# Patient Record
Sex: Female | Born: 1960 | Race: White | Hispanic: No | Marital: Single | Smoking: Former smoker
Health system: Southern US, Community
[De-identification: ages and names within clinical notes are randomized; demographics above are authoritative.]

## PROBLEM LIST (undated history)

## (undated) DIAGNOSIS — K5909 Other constipation: Secondary | ICD-10-CM

## (undated) DIAGNOSIS — G43909 Migraine, unspecified, not intractable, without status migrainosus: Secondary | ICD-10-CM

## (undated) DIAGNOSIS — Z842 Family history of other diseases of the genitourinary system: Secondary | ICD-10-CM

## (undated) DIAGNOSIS — F419 Anxiety disorder, unspecified: Secondary | ICD-10-CM

## (undated) DIAGNOSIS — T7840XA Allergy, unspecified, initial encounter: Secondary | ICD-10-CM

## (undated) DIAGNOSIS — IMO0002 Reserved for concepts with insufficient information to code with codable children: Secondary | ICD-10-CM

## (undated) HISTORY — DX: Other constipation: K59.09

## (undated) HISTORY — DX: Allergy, unspecified, initial encounter: T78.40XA

## (undated) HISTORY — PX: TOTAL ABDOMINAL HYSTERECTOMY W/ BILATERAL SALPINGOOPHORECTOMY: SHX83

## (undated) HISTORY — PX: WISDOM TOOTH EXTRACTION: SHX21

## (undated) HISTORY — PX: OTHER SURGICAL HISTORY: SHX169

## (undated) HISTORY — DX: Family history of other diseases of the genitourinary system: Z84.2

## (undated) HISTORY — DX: Migraine, unspecified, not intractable, without status migrainosus: G43.909

---

## 2005-10-30 ENCOUNTER — Ambulatory Visit: Payer: Self-pay | Admitting: Family Medicine

## 2005-11-12 ENCOUNTER — Ambulatory Visit: Payer: Self-pay | Admitting: Family Medicine

## 2005-12-16 ENCOUNTER — Ambulatory Visit: Payer: Self-pay | Admitting: Family Medicine

## 2006-01-08 ENCOUNTER — Ambulatory Visit: Payer: Self-pay | Admitting: Family Medicine

## 2006-02-23 ENCOUNTER — Ambulatory Visit: Payer: Self-pay | Admitting: Family Medicine

## 2006-02-26 ENCOUNTER — Ambulatory Visit: Payer: Self-pay | Admitting: Family Medicine

## 2006-03-17 ENCOUNTER — Ambulatory Visit: Payer: Self-pay | Admitting: Family Medicine

## 2006-04-22 ENCOUNTER — Ambulatory Visit: Payer: Self-pay | Admitting: Family Medicine

## 2006-07-14 DIAGNOSIS — G43909 Migraine, unspecified, not intractable, without status migrainosus: Secondary | ICD-10-CM | POA: Insufficient documentation

## 2006-07-14 DIAGNOSIS — F411 Generalized anxiety disorder: Secondary | ICD-10-CM | POA: Insufficient documentation

## 2006-07-14 DIAGNOSIS — J309 Allergic rhinitis, unspecified: Secondary | ICD-10-CM | POA: Insufficient documentation

## 2006-07-14 DIAGNOSIS — G5600 Carpal tunnel syndrome, unspecified upper limb: Secondary | ICD-10-CM

## 2006-07-14 DIAGNOSIS — F339 Major depressive disorder, recurrent, unspecified: Secondary | ICD-10-CM | POA: Insufficient documentation

## 2006-07-16 ENCOUNTER — Ambulatory Visit: Payer: Self-pay | Admitting: Family Medicine

## 2006-09-03 ENCOUNTER — Ambulatory Visit: Payer: Self-pay | Admitting: Family Medicine

## 2006-09-03 ENCOUNTER — Encounter: Payer: Self-pay | Admitting: Family Medicine

## 2006-09-14 ENCOUNTER — Ambulatory Visit: Payer: Self-pay | Admitting: Family Medicine

## 2006-09-14 LAB — CONVERTED CEMR LAB: Inflenza A Ag: NEGATIVE

## 2006-11-26 ENCOUNTER — Ambulatory Visit: Payer: Self-pay | Admitting: Family Medicine

## 2006-12-29 ENCOUNTER — Telehealth: Payer: Self-pay | Admitting: Family Medicine

## 2007-01-06 ENCOUNTER — Encounter: Payer: Self-pay | Admitting: Family Medicine

## 2007-01-07 ENCOUNTER — Encounter: Payer: Self-pay | Admitting: Family Medicine

## 2007-05-31 ENCOUNTER — Ambulatory Visit: Payer: Self-pay | Admitting: Family Medicine

## 2007-05-31 DIAGNOSIS — R002 Palpitations: Secondary | ICD-10-CM | POA: Insufficient documentation

## 2007-05-31 DIAGNOSIS — IMO0001 Reserved for inherently not codable concepts without codable children: Secondary | ICD-10-CM

## 2007-06-01 ENCOUNTER — Encounter: Payer: Self-pay | Admitting: Family Medicine

## 2007-06-01 LAB — CONVERTED CEMR LAB
HCT: 45.4 % (ref 36.0–46.0)
TSH: 1.3 microintl units/mL (ref 0.350–5.50)
WBC: 8.2 10*3/uL (ref 4.0–10.5)

## 2007-06-02 ENCOUNTER — Telehealth (INDEPENDENT_AMBULATORY_CARE_PROVIDER_SITE_OTHER): Payer: Self-pay | Admitting: *Deleted

## 2007-07-08 ENCOUNTER — Ambulatory Visit: Payer: Self-pay | Admitting: Family Medicine

## 2008-02-10 ENCOUNTER — Ambulatory Visit: Payer: Self-pay | Admitting: Family Medicine

## 2008-03-24 ENCOUNTER — Ambulatory Visit: Payer: Self-pay | Admitting: Family Medicine

## 2008-03-31 ENCOUNTER — Telehealth: Payer: Self-pay | Admitting: Family Medicine

## 2008-03-31 ENCOUNTER — Ambulatory Visit: Payer: Self-pay | Admitting: Family Medicine

## 2008-03-31 DIAGNOSIS — B079 Viral wart, unspecified: Secondary | ICD-10-CM | POA: Insufficient documentation

## 2008-04-10 ENCOUNTER — Telehealth: Payer: Self-pay | Admitting: Family Medicine

## 2008-05-22 ENCOUNTER — Ambulatory Visit: Payer: Self-pay | Admitting: Family Medicine

## 2008-05-22 DIAGNOSIS — M549 Dorsalgia, unspecified: Secondary | ICD-10-CM | POA: Insufficient documentation

## 2008-05-29 ENCOUNTER — Ambulatory Visit: Payer: Self-pay | Admitting: Family Medicine

## 2008-05-29 ENCOUNTER — Encounter: Payer: Self-pay | Admitting: Family Medicine

## 2008-05-29 LAB — CONVERTED CEMR LAB
Bilirubin Urine: NEGATIVE
Blood in Urine, dipstick: NEGATIVE
Eosinophils Absolute: 0 10*3/uL (ref 0.0–0.7)
Glucose, Urine, Semiquant: NEGATIVE
HCT: 45.6 % (ref 36.0–46.0)
Hemoglobin: 15.2 g/dL — ABNORMAL HIGH (ref 12.0–15.0)
Ketones, urine, test strip: NEGATIVE
Lymphocytes Relative: 34 % (ref 12–46)
Monocytes Relative: 3 % (ref 3–12)
Neutro Abs: 4.7 10*3/uL (ref 1.7–7.7)
Nitrite: NEGATIVE
Platelets: 339 10*3/uL (ref 150–400)
Protein, U semiquant: NEGATIVE
RBC: 5.23 M/uL — ABNORMAL HIGH (ref 3.87–5.11)
Specific Gravity, Urine: 1.02
Urobilinogen, UA: 0.2
WBC: 7.4 10*3/uL (ref 4.0–10.5)

## 2008-05-30 LAB — CONVERTED CEMR LAB
AST: 18 units/L (ref 0–37)
Albumin: 4.5 g/dL (ref 3.5–5.2)
Alkaline Phosphatase: 79 units/L (ref 39–117)
CO2: 25 meq/L (ref 19–32)
Calcium: 9.6 mg/dL (ref 8.4–10.5)
Potassium: 4.3 meq/L (ref 3.5–5.3)

## 2008-06-01 ENCOUNTER — Telehealth: Payer: Self-pay | Admitting: Family Medicine

## 2008-06-01 ENCOUNTER — Encounter: Admission: RE | Admit: 2008-06-01 | Discharge: 2008-06-01 | Payer: Self-pay | Admitting: Family Medicine

## 2008-06-01 ENCOUNTER — Ambulatory Visit: Payer: Self-pay | Admitting: Family Medicine

## 2008-06-06 ENCOUNTER — Ambulatory Visit (HOSPITAL_COMMUNITY): Payer: Self-pay | Admitting: Psychology

## 2008-06-15 ENCOUNTER — Ambulatory Visit (HOSPITAL_COMMUNITY): Payer: Self-pay | Admitting: Psychology

## 2008-07-31 ENCOUNTER — Other Ambulatory Visit: Admission: RE | Admit: 2008-07-31 | Discharge: 2008-07-31 | Payer: Self-pay | Admitting: Family Medicine

## 2008-07-31 ENCOUNTER — Encounter: Payer: Self-pay | Admitting: Family Medicine

## 2008-07-31 ENCOUNTER — Ambulatory Visit: Payer: Self-pay | Admitting: Family Medicine

## 2008-07-31 LAB — HM PAP SMEAR

## 2008-08-01 ENCOUNTER — Encounter: Payer: Self-pay | Admitting: Family Medicine

## 2008-08-03 LAB — CONVERTED CEMR LAB
Cholesterol: 164 mg/dL (ref 0–200)
HCV Ab: NEGATIVE
HDL: 60 mg/dL (ref >39)
LDL Cholesterol: 84 mg/dL (ref 0–99)
TSH: 1.971 microintl units/mL (ref 0.350–4.50)
Total CHOL/HDL Ratio: 2.7
Triglycerides: 101 mg/dL (ref <150)

## 2008-10-27 ENCOUNTER — Telehealth (INDEPENDENT_AMBULATORY_CARE_PROVIDER_SITE_OTHER): Payer: Self-pay | Admitting: *Deleted

## 2008-10-30 ENCOUNTER — Ambulatory Visit: Payer: Self-pay | Admitting: Family Medicine

## 2008-11-23 ENCOUNTER — Telehealth: Payer: Self-pay | Admitting: Family Medicine

## 2009-04-18 ENCOUNTER — Telehealth: Payer: Self-pay | Admitting: Family Medicine

## 2009-05-22 ENCOUNTER — Ambulatory Visit: Payer: Self-pay | Admitting: Family Medicine

## 2009-05-24 ENCOUNTER — Ambulatory Visit: Payer: Self-pay | Admitting: Family Medicine

## 2009-05-24 DIAGNOSIS — B429 Sporotrichosis, unspecified: Secondary | ICD-10-CM

## 2009-05-25 ENCOUNTER — Encounter: Payer: Self-pay | Admitting: Family Medicine

## 2009-05-28 ENCOUNTER — Telehealth (INDEPENDENT_AMBULATORY_CARE_PROVIDER_SITE_OTHER): Payer: Self-pay | Admitting: *Deleted

## 2009-06-04 ENCOUNTER — Telehealth: Payer: Self-pay | Admitting: Family Medicine

## 2009-06-14 ENCOUNTER — Encounter: Payer: Self-pay | Admitting: Family Medicine

## 2009-06-15 ENCOUNTER — Encounter: Payer: Self-pay | Admitting: Family Medicine

## 2009-07-02 ENCOUNTER — Encounter: Payer: Self-pay | Admitting: Family Medicine

## 2009-07-06 ENCOUNTER — Telehealth: Payer: Self-pay | Admitting: Family Medicine

## 2009-09-17 ENCOUNTER — Ambulatory Visit: Payer: Self-pay | Admitting: Family Medicine

## 2009-09-17 DIAGNOSIS — K5900 Constipation, unspecified: Secondary | ICD-10-CM | POA: Insufficient documentation

## 2009-09-18 ENCOUNTER — Telehealth: Payer: Self-pay | Admitting: Family Medicine

## 2009-09-19 ENCOUNTER — Encounter (INDEPENDENT_AMBULATORY_CARE_PROVIDER_SITE_OTHER): Payer: Self-pay | Admitting: *Deleted

## 2009-09-25 ENCOUNTER — Telehealth: Payer: Self-pay | Admitting: Family Medicine

## 2009-10-01 ENCOUNTER — Telehealth: Payer: Self-pay | Admitting: Family Medicine

## 2009-11-22 ENCOUNTER — Telehealth: Payer: Self-pay | Admitting: Family Medicine

## 2009-11-23 ENCOUNTER — Telehealth (INDEPENDENT_AMBULATORY_CARE_PROVIDER_SITE_OTHER): Payer: Self-pay | Admitting: *Deleted

## 2009-11-30 ENCOUNTER — Encounter: Payer: Self-pay | Admitting: Family Medicine

## 2010-01-08 ENCOUNTER — Ambulatory Visit: Payer: Self-pay | Admitting: Family Medicine

## 2010-01-08 DIAGNOSIS — M5030 Other cervical disc degeneration, unspecified cervical region: Secondary | ICD-10-CM

## 2010-01-09 ENCOUNTER — Encounter: Payer: Self-pay | Admitting: Family Medicine

## 2010-01-10 LAB — CONVERTED CEMR LAB
ALT: 26 units/L (ref 0–35)
Albumin: 4.4 g/dL (ref 3.5–5.2)
Basophils Absolute: 0 10*3/uL (ref 0.0–0.1)
Basophils Relative: 0 % (ref 0–1)
Calcium: 9 mg/dL (ref 8.4–10.5)
Creatinine, Ser: 0.77 mg/dL (ref 0.40–1.20)
Eosinophils Relative: 2 % (ref 0–5)
FSH: 55.3 milliintl units/mL
Glucose, Bld: 94 mg/dL (ref 70–99)
HCT: 45.3 % (ref 36.0–46.0)
LH: 59 milliintl units/mL
Lymphocytes Relative: 40 % (ref 12–46)
MCHC: 32.7 g/dL (ref 30.0–36.0)
MCV: 88.5 fL (ref 78.0–100.0)
Monocytes Relative: 10 % (ref 3–12)
Neutrophils Relative %: 49 % (ref 43–77)
RDW: 14.1 % (ref 11.5–15.5)
Sodium: 137 meq/L (ref 135–145)
Total CHOL/HDL Ratio: 2.7
Total Protein: 7 g/dL (ref 6.0–8.3)
WBC: 6 10*3/uL (ref 4.0–10.5)

## 2010-01-21 ENCOUNTER — Encounter: Admission: RE | Admit: 2010-01-21 | Discharge: 2010-01-21 | Payer: Self-pay | Admitting: Family Medicine

## 2010-01-21 ENCOUNTER — Ambulatory Visit: Payer: Self-pay | Admitting: Family Medicine

## 2010-01-21 DIAGNOSIS — G43009 Migraine without aura, not intractable, without status migrainosus: Secondary | ICD-10-CM

## 2010-03-05 ENCOUNTER — Ambulatory Visit: Payer: Self-pay | Admitting: Family Medicine

## 2010-03-05 DIAGNOSIS — E669 Obesity, unspecified: Secondary | ICD-10-CM | POA: Insufficient documentation

## 2010-04-05 ENCOUNTER — Ambulatory Visit: Payer: Self-pay | Admitting: Family Medicine

## 2010-04-05 DIAGNOSIS — L299 Pruritus, unspecified: Secondary | ICD-10-CM | POA: Insufficient documentation

## 2010-04-09 ENCOUNTER — Telehealth: Payer: Self-pay | Admitting: Family Medicine

## 2010-04-12 ENCOUNTER — Encounter: Payer: Self-pay | Admitting: Family Medicine

## 2010-05-08 ENCOUNTER — Ambulatory Visit: Payer: Self-pay | Admitting: Family Medicine

## 2010-05-08 DIAGNOSIS — J019 Acute sinusitis, unspecified: Secondary | ICD-10-CM

## 2010-05-17 ENCOUNTER — Telehealth: Payer: Self-pay | Admitting: Family Medicine

## 2010-08-08 ENCOUNTER — Ambulatory Visit (HOSPITAL_COMMUNITY)
Admission: RE | Admit: 2010-08-08 | Discharge: 2010-08-08 | Payer: Self-pay | Source: Home / Self Care | Admitting: Psychiatry

## 2010-08-08 ENCOUNTER — Ambulatory Visit: Payer: Self-pay | Admitting: Family Medicine

## 2010-08-08 DIAGNOSIS — J029 Acute pharyngitis, unspecified: Secondary | ICD-10-CM | POA: Insufficient documentation

## 2010-08-12 ENCOUNTER — Telehealth: Payer: Self-pay | Admitting: Family Medicine

## 2010-08-13 ENCOUNTER — Ambulatory Visit: Payer: Self-pay | Admitting: Family Medicine

## 2010-08-13 ENCOUNTER — Telehealth: Payer: Self-pay | Admitting: Family Medicine

## 2010-08-13 DIAGNOSIS — F329 Major depressive disorder, single episode, unspecified: Secondary | ICD-10-CM | POA: Insufficient documentation

## 2010-08-19 ENCOUNTER — Encounter: Payer: Self-pay | Admitting: Family Medicine

## 2010-08-19 ENCOUNTER — Telehealth: Payer: Self-pay | Admitting: Family Medicine

## 2010-08-20 ENCOUNTER — Ambulatory Visit: Payer: Self-pay | Admitting: Family Medicine

## 2010-08-22 ENCOUNTER — Telehealth (INDEPENDENT_AMBULATORY_CARE_PROVIDER_SITE_OTHER): Payer: Self-pay | Admitting: *Deleted

## 2010-09-11 ENCOUNTER — Ambulatory Visit: Payer: Self-pay | Admitting: Family Medicine

## 2010-09-11 DIAGNOSIS — M25569 Pain in unspecified knee: Secondary | ICD-10-CM

## 2010-09-13 ENCOUNTER — Telehealth (INDEPENDENT_AMBULATORY_CARE_PROVIDER_SITE_OTHER): Payer: Self-pay | Admitting: *Deleted

## 2010-10-08 ENCOUNTER — Telehealth: Payer: Self-pay | Admitting: Family Medicine

## 2010-10-16 ENCOUNTER — Telehealth: Payer: Self-pay | Admitting: Family Medicine

## 2010-11-05 ENCOUNTER — Encounter: Payer: Self-pay | Admitting: Family Medicine
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
## 2010-11-05 NOTE — Letter (Signed)
Summary: Generic Letter  [facility]  [address]   [place], [place] [address]   Phone: [phone]  Fax: [phone]    04/05/2010  ERMALEE MEALY [address] [place], [place]  [address]  To Whom It May Concern,  Ms. Cassandra Ford is medically cleared to work 40 hours per week.        Sincerely,    [name] DO
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
## 2010-11-05 NOTE — Progress Notes (Signed)
Summary: Work note  Phone Note Call from Patient   Caller: Patient Summary of Call: Pt still unable to go to work and needs work note ASAP. Please advise. Initial call taken by: [name] CMA,  August 19, 2010 9:09 AM  Follow-up for Phone Call        she really needs to fax [place] FMLA papers. I've already done 2 work notes. Follow-up by: [name] DO,  August 19, 2010 9:31 AM     Appended Document: Work note Pt will have fax them again. She said she really needs a note for today or she will be fired.
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
## 2010-11-05 NOTE — Progress Notes (Signed)
Summary: no better  Phone Note Call from Patient Call back at Home Phone [phone]   Caller: Patient Call For: [name] DO Summary of Call: CHestcold wprse- been using Zyrtec and is not working, still coughing, chest congestion, bad headache, blood tinged mucous from nose, head congestion. Doesn't feel no better- [facility] [address] [facility] [place]. Told to call back if no better- ? needs antibiotic and cough med Initial call taken by: Kathlene November LPN,  August 12, 2010 10:31 AM  Follow-up for Phone Call        find out first if she was seen at behavioral health, then I will send in her prescriptions. Follow-up by: [name] DO,  August 12, 2010 10:32 AM  Additional Follow-up for Phone Call Additional follow up Details #1::        She says she did call and has appt and was also given a list of counselors to call as well Additional Follow-up by: Kathlene November LPN,  August 12, 2010 10:34 AM    New/Updated Medications: AMOXICILLIN-POT CLAVULANATE 875-125 MG TABS (AMOXICILLIN-POT CLAVULANATE) 1 tab by mouth q 12 hrs x 10 days BENZONATATE 200 MG CAPS (BENZONATATE) 1 capsule by mouth three times a day as needed cough Prescriptions: BENZONATATE 200 MG CAPS (BENZONATATE) 1 capsule by mouth three times a day as needed cough  #30 x 0   Entered and Authorized by:   [name] DO   Signed by:   [name] DO on 08/12/2010   Method used:   Electronically to        [facility] [phone]* (retail)       [address] [place], [place]  [address]       Ph: [phone] or [phone]       Fax: [phone]   RxID:   [id number] AMOXICILLIN-POT CLAVULANATE 875-125 MG TABS (AMOXICILLIN-POT CLAVULANATE) 1 tab by mouth q 12 hrs x 10 days  #20 x 0   Entered and Authorized by:   [name] DO   Signed by:   [name] DO on 08/12/2010   Method used:   Electronically to        [facility] [phone]* (retail)       [address] [place], [place]  [address]       Ph: [phone]  or [phone]       Fax: [phone]   RxID:   [id number]
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
## 2010-11-05 NOTE — Progress Notes (Signed)
Summary: Refill ABX  Phone Note Call from Patient   Caller: Patient Summary of Call: Pt states she completed abx but is not feeling any better. Pt requests refill on abx. Please advise. Initial call taken by: [name] CMA,  May 17, 2010 12:27 PM  Follow-up for Phone Call        Did they help at all?  If so, I will extend her 7 more days.   Follow-up by: [name] DO,  May 17, 2010 12:32 PM  Additional Follow-up for Phone Call Additional follow up Details #1::        Pt said they did help but she is not completely better. Additional Follow-up by: [name] CMA,  May 17, 2010 12:36 PM    Prescriptions: AMOXICILLIN 875 MG TABS (AMOXICILLIN) 1 tab by mouth q 12 hrs x 7 days  #14 x 0   Entered and Authorized by:   [name] DO   Signed by:   [name] DO on 05/17/2010   Method used:   [facility] to        [facility] [phone]* (retail)       [address] [place], [place]  [address]       Ph: [phone] or [phone]       Fax: [phone]   RxID:   [id number]   Appended Document: Refill ABX LMOM informing Pt of the above
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
## 2010-11-05 NOTE — Letter (Signed)
Summary: Out of Work  [facility]  [address]   [place], [place] [address]   Phone: [phone]  Fax: [phone]    May 08, 2010   Employee:  KERRIN MARKMAN    To Whom It May Concern:   For Medical reasons, please excuse the above named employee from work for the following dates:  Start:   Aug 1st, 2nd, 3rd  End:   Aug 4th  If you need additional information, please feel free to contact our office.         Sincerely,    [name] DO
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
## 2010-11-05 NOTE — Progress Notes (Signed)
Summary: Maxalt Rx  Phone Note Call from Patient   Caller: Patient Summary of Call: Pt states Maxalt works well for onset of migraines. Pt needs Rx sent to pharm.  Initial call taken by: [name] CMA,  November 22, 2009 11:57 AM    New/Updated Medications: MAXALT 10 MG TABS (RIZATRIPTAN BENZOATE) 1 tab by mouth x1 as needed migraine  onset/; repeat in 2 hrs if needed Prescriptions: MAXALT 10 MG TABS (RIZATRIPTAN BENZOATE) 1 tab by mouth x1 as needed migraine  onset/; repeat in 2 hrs if needed  #18 x 1   Entered and Authorized by:   [name] DO   Signed by:   [name] DO on 11/22/2009   Method used:   Electronically to        [facility] [phone]* (retail)       [address] [place], [place]  [address]       Ph: [phone] or [phone]       Fax: [phone]   RxID:   [id number]   Appended Document: Maxalt Rx Pt aware

## 2010-11-05 NOTE — Letter (Signed)
Summary: [facility]  [facility]   Imported By: Lanelle Bal 04/23/2010 13:46:08  _____________________________________________________________________  External Attachment:    Type:   Image     Comment:   External Document

## 2010-11-05 NOTE — Progress Notes (Signed)
Summary: Family Medical Leave extension?  Phone Note Call from Patient Call back at Home Phone [phone]   Caller: Patient Call For: [name] DO Summary of Call: Pt calls and wanted to know if her Family Medical leave had been extended for another month. Also wanted xanax filled- states pharmacy never received rx from our office on 9th.  [facility]- gave verbal refill to pharmacist. Please call pt in regards to the family medical leave if been done Initial call taken by: Kathlene November LPN,  September 13, 2010 1:51 PM  Follow-up for Phone Call        Pt aware that Rx has been called in. FMLA papers are being worked on as well Follow-up by: [name] CMA,  September 13, 2010 2:54 PM

## 2010-11-05 NOTE — Progress Notes (Signed)
Summary: work note  Phone Note Call from Patient Call back at [employer] [phone]   Caller: Patient Call For: [name] DO Summary of Call: Pt calls again today and wanting to know if could get extension on work note and how will it take before antibiotic will make her fell better because she feels awful Initial call taken by: Kathlene November LPN,  August 13, 2010 8:58 AM  Follow-up for Phone Call        The work note was written on the premise of her getting psychiatric care which she has not yet done.  It will take her 2-3 days on abx to start feeling better.  I can write her out for her sinusitis today and tomorrow only. Follow-up by: [name] DO,  August 13, 2010 10:05 AM     Appended Document: work note 08/13/2010- Spoke with pt and given MD instructions and she says she made appt with MD today at 11am. Also she says she is very confused and that psych can not see her for months. Instructed her to call Behavioral Health back and go ahead and set up appt with counselor until can be seen by psych.KJ LPN
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
## 2010-11-05 NOTE — Letter (Signed)
Summary: Out of Work  [facility]  [address]   [place], [place] [address]   Phone: [phone]  Fax: [phone]    January 21, 2010   Employee:  YONEKO TALERICO    To Whom It May Concern:   For Medical reasons, please excuse the above named employee from work for the following dates:  Start:   April 18th -19th  End:   April 20th  If you need additional information, please feel free to contact our office.         Sincerely,    [name] DO

## 2010-11-05 NOTE — Progress Notes (Signed)
Summary: RF alpazolam  Phone Note Refill Request Message from:  Fax from Pharmacy on April 09, 2010 2:53 PM  Refills Requested: Medication #1:  ALPRAZOLAM 0.5 MG TABS Take 1 tablet by mouth two times a day   Supply Requested: 1 month   Last Refilled: 03/08/2010   Notes: 1 tab twice per day Initial call taken by: Fabienne Bruns,  April 09, 2010 2:53 PM    Prescriptions: ALPRAZOLAM 0.5 MG TABS (ALPRAZOLAM) Take 1 tablet by mouth two times a day  #60 x 0   Entered and Authorized by:   [name] DO   Signed by:   [name] DO on 04/09/2010   Method used:   Printed then faxed to ...       [facility]  [place] [phone]* (retail[facility]       [address] [place], [place]  [address]       Ph: [phone] or [phone]       Fax: [phone]   RxID:   [id number]

## 2010-11-05 NOTE — Letter (Signed)
Summary: Out of Work  [facility]  [address]   [place], [place] [address]   Phone: [phone]  Fax: [phone]    August 13, 2010   Employee:  TAEJAH OHALLORAN    To Whom It May Concern:   For Medical reasons, please excuse the above named employee from work for the following dates:  Start:   Nov 8th - 11th  End:   Nov 12th  If you need additional information, please feel free to contact our office.         Sincerely,    [name] DO

## 2010-11-05 NOTE — Progress Notes (Signed)
Summary: [facility] CASE [id number] FOR MAXALT   Phone Note Outgoing Call   Call placed by: [facility] Call placed to: [facility] Summary of Call: [facility] CASE ID# [id number] FOR MAXALT 10 MG TABLETS FAXING FORM TO MICHELLE [facility] Initial call taken by: Roselle Locus,  November 23, 2009 11:43 AM  Follow-up for Phone Call        Form placed on Dr. Ovidio Kin desk Follow-up by: [name] CMA,  November 23, 2009 1:14 PM
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
## 2010-11-05 NOTE — Medication Information (Signed)
Summary: Approval for Additional Quantity Maxalt/[facility]  Approval for Additional Quantity Maxalt/[facility]   Imported By: Lanelle Bal 12/06/2009 09:44:29  _____________________________________________________________________  External Attachment:    Type:   Image     Comment:   External Document

## 2010-11-05 NOTE — Letter (Signed)
Summary: Out of Work  [facility]  [address]   [place], [place] [address]   Phone: [phone]  Fax: [phone]    August 08, 2010   Employee:  MIONNA ADVINCULA    To Whom It May Concern:   For Medical reasons, please excuse the above named employee from work for the following dates:  Start:   Nov 3rd - 9th  End:   Nov 10th  If you need additional information, please feel free to contact our office.         Sincerely,    [name] DO

## 2010-11-05 NOTE — Letter (Signed)
Summary: Out of Work  [facility]  [address]   [place], [place] [address]   Phone: [phone]  Fax: [phone]    August 19, 2010   Employee:  Cassandra Ford    To Whom It May Concern:   For Medical reasons, please excuse the above named employee from work for the following dates:  Start:   Nov 14th  End:   Nov 15th  If you need additional information, please feel free to contact our office.         Sincerely,    [name] DO

## 2010-11-05 NOTE — Progress Notes (Signed)
Summary: FMLA  Phone Note Call from Patient Call back at [phone]   Caller: Patient Call For: [name] DO Summary of Call: pt called and states FMLA paperwork should be faxed to Dr. Ovidio Kin attention and it needs to be filled out and faxed and then mail the original form.Pt wants to know if the FMLA will cover tomorrow.Pt would like a call back Initial call taken by: Avon Gully CMA, Duncan Dull),  August 19, 2010 3:41 PM  Follow-up for Phone Call        I can back date it which is fine.  Due to a full schedule, it may take up to a week for me to complete this. Follow-up by: [name] DO,  August 19, 2010 4:13 PM     Appended Document: FMLA Pt aware of the above.

## 2010-11-05 NOTE — Progress Notes (Signed)
Summary: Out of work note questions and FMLA  Phone Note Call from Patient Call back at [employer] [phone]   Caller: Patient Call For: [name] DO Summary of Call: Please call patient there is confusion on work note and FMLA. Pt states you told her that there was a note faxed to her work putting her out until december but I do not see any thing in chart regarding this date. Says judge called and LM on her VM in regards to her job. Initial call taken by: Kathlene November LPN,  August 22, 2010 3:37 PM  Follow-up for Phone Call        [facility] informing Pt that FMLA papers were filled out and faxed to local employer as well as her state HR office. Copy of FMLA not yet in chart bc it has been to scanning.  Follow-up by: [name] CMA,  August 23, 2010 9:35 AM

## 2010-11-05 NOTE — Progress Notes (Signed)
Summary: Med refill  Phone Note Refill Request Message from:  Patient on September 13, 2010 1:30 PM  Refills Requested: Medication #1:  ALPRAZOLAM 0.5 MG TABS Take 1 tablet by mouth two times a day   Dosage confirmed as above?[facility] [address].... Pt would like a phone call when it's ready.  Initial call taken by: Michaelle Copas,  September 13, 2010 1:31 PM    Prescriptions: ALPRAZOLAM 0.5 MG TABS (ALPRAZOLAM) Take 1 tablet by mouth two times a day  #60 x 0   Entered by:   [name] CMA   Authorized by:   [name] DO   Signed by:   [name] CMA on 09/13/2010   Method used:   Printed then faxed to ...       [facility]  [place] [phone][facility] (retail)       [address] [place], [place]  [address]       Ph: [phone] or [phone]       Fax: [phone]   RxID:   [id number]

## 2010-11-07 NOTE — Progress Notes (Signed)
Summary: FMLA papers  Phone Note From Other Clinic   Caller: [facility] Call For: [facility] Summary of Call: Pt told Dr. Suzan Garibaldi office that you had FMLA papers for her here and they want those papers faxed to their office so they can fill them out. Fax to [phone] Initial call taken by: Kathlene November LPN,  October 08, 2010 2:28 PM  Follow-up for Phone Call        I do not have a new set.  We requested the last ones directly from Niyanna's HR rep. Follow-up by: [name] DO,  October 08, 2010 3:58 PM  Additional Follow-up for Phone Call Additional follow up Details #1::        Called [facility] and informed them we did not have any. She will call pt and have her get them from her HR rep. Additional Follow-up by: Kathlene November LPN,  October 08, 2010 4:03 PM

## 2010-11-07 NOTE — Progress Notes (Signed)
Summary: FMLA   Phone Note Call from Patient   Caller: Patient Summary of Call: Pt. called and wants to know if Dr.Taevion Sikora will be filling our FMLA extension to 11/12/10, per Mila Homer.... Pt. states that Dr. Lequita Halt needs to know and patient needs this done TODAY. Please call patient at [phone] Initial call taken by: Michaelle Copas,  October 16, 2010 1:54 PM  Follow-up for Phone Call        per my last note, she was extended till Jan 7th (?)  since she is f/u with psych and the reason she is out is due to psych reasons, they need to fill out an up to date FMLA this time. Follow-up by: [name] DO,  October 16, 2010 1:57 PM

## 2010-11-13 ENCOUNTER — Encounter: Payer: Self-pay | Admitting: Family Medicine

## 2010-11-14 ENCOUNTER — Telehealth (INDEPENDENT_AMBULATORY_CARE_PROVIDER_SITE_OTHER): Payer: Self-pay | Admitting: *Deleted

## 2010-11-20 ENCOUNTER — Encounter: Payer: Self-pay | Admitting: Family Medicine

## 2010-11-20 ENCOUNTER — Ambulatory Visit (INDEPENDENT_AMBULATORY_CARE_PROVIDER_SITE_OTHER): Payer: BC Managed Care – PPO | Admitting: Family Medicine

## 2010-11-20 DIAGNOSIS — G43009 Migraine without aura, not intractable, without status migrainosus: Secondary | ICD-10-CM

## 2010-11-20 DIAGNOSIS — R413 Other amnesia: Secondary | ICD-10-CM | POA: Insufficient documentation

## 2010-11-20 DIAGNOSIS — F329 Major depressive disorder, single episode, unspecified: Secondary | ICD-10-CM

## 2010-11-21 NOTE — Progress Notes (Signed)
Summary: Refill Request  Phone Note Refill Request Call back at Home Phone [phone] [facility]   Message from:  Patient on November 14, 2010 2:24 PM  Refills Requested: Medication #1:  ALPRAZOLAM 0.5 MG TABS Take 1 tablet by mouth two times a day   Dosage confirmed as above?Dosage Confirmed   Brand Name Necessary? No Patient is completey out of med. and would like a phone call back when this has been called in.Jennifer Rich  November 14, 2010 2:24 PM   Initial call taken by: Michaelle Copas,  November 14, 2010 2:24 PM    Prescriptions: ALPRAZOLAM 0.5 MG TABS (ALPRAZOLAM) Take 1 tablet by mouth two times a day  #60 x 0   Entered by:   [name] CMA   Authorized by:   [name] DO   Signed by:   [name] CMA on 11/15/2010   Method used:   Printed then faxed to ...       [facility]  [place] [phone]* (retail)       [address] [place], [place]  [address]       Ph: [phone] or [phone]       Fax: [phone]   RxID:   [id number]
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
## 2010-12-03 NOTE — Letter (Signed)
Summary: [facility]  [facility]   Imported By: Lanelle Bal 11/28/2010 11:18:06  _____________________________________________________________________  External Attachment:    Type:   Image     Comment:   External Document

## 2010-12-05 ENCOUNTER — Encounter: Payer: Self-pay | Admitting: Family Medicine

## 2010-12-24 NOTE — Letter (Signed)
Summary: [facility]  [facility]   Imported By: Maryln Gottron 12/17/2010 08:38:24  _____________________________________________________________________  External Attachment:    Type:   Image     Comment:   External Document

## 2011-01-06 ENCOUNTER — Other Ambulatory Visit: Payer: Self-pay | Admitting: Family Medicine

## 2011-01-07 ENCOUNTER — Encounter: Payer: Self-pay | Admitting: Family Medicine

## 2011-01-08 ENCOUNTER — Ambulatory Visit (INDEPENDENT_AMBULATORY_CARE_PROVIDER_SITE_OTHER): Payer: BC Managed Care – PPO | Admitting: Family Medicine

## 2011-01-08 ENCOUNTER — Encounter: Payer: Self-pay | Admitting: Family Medicine

## 2011-01-08 DIAGNOSIS — L255 Unspecified contact dermatitis due to plants, except food: Secondary | ICD-10-CM

## 2011-01-08 DIAGNOSIS — F329 Major depressive disorder, single episode, unspecified: Secondary | ICD-10-CM

## 2011-01-08 MED ORDER — RIZATRIPTAN BENZOATE 10 MG PO TABS: 10.0000 mg | ORAL_TABLET | ORAL | Status: DC | PRN

## 2011-01-08 MED ORDER — PREDNISONE 20 MG PO TABS: ORAL_TABLET | ORAL | Status: DC

## 2011-01-08 MED ORDER — HYDROXYZINE HCL 25 MG PO TABS: 25.0000 mg | ORAL_TABLET | Freq: Every evening | ORAL | Status: DC | PRN

## 2011-01-08 NOTE — Patient Instructions (Signed)
Start Prednisone 3 tabs once daily x 2 days then                             2 tabs once daily x 2 days then                             1 tab po once daily x 2 days then                             1/2 tab po once daily x 2 days.  This will help the rash and itching. Use topical Caladryl cream and Hydroxyzine at night.  I will talk to your neurologist re: further workup.

## 2011-01-08 NOTE — Progress Notes (Signed)
  Subjective:    Patient ID: Cassandra Ford, female    DOB: April 21, 1961, 50 y.o.   MRN: [id number]  HPI  50 yo WF with hx of depression presents for an itchy rash on her hands and face that started 3 days ago after playing in the garden.  She is taking oral benadryl and using topical hydrocortisone but it's not helping.  Denies fevers or chills or sore throat.  She is has some swelling in her hands from the rash.  She continues to c/o mental slowing, problems getting words out, etc but she says that she has f/u with Dr Lenise Arena (neuro).  BP 136/86  Pulse 97  Temp(Src) 98.3 F (36.8 C) (Oral)  Ht 5' 6.5" (1.689 m)  Wt 213 lb (96.616 kg)  BMI 33.86 kg/m2  SpO2 96%  Review of Systems  Constitutional: Negative for fever, fatigue and unexpected weight change.  HENT: Negative for sore throat.   Respiratory: Positive for cough. Negative for shortness of breath.   Cardiovascular: Negative for chest pain and leg swelling.  Neurological: Positive for headaches.  Psychiatric/Behavioral: Positive for dysphoric mood. The patient is nervous/anxious.        Objective:   Physical Exam  Constitutional: She appears well-developed and well-nourished. No distress.  HENT:  Head: Normocephalic and atraumatic.  Eyes: Conjunctivae are normal.  Neck: Normal range of motion. Neck supple.  Cardiovascular: Normal rate, regular rhythm and normal heart sounds.   Pulmonary/Chest: Effort normal and breath sounds normal. She has no wheezes.  Musculoskeletal: She exhibits no edema.  Lymphadenopathy:    She has no cervical adenopathy.  Skin: Skin is warm. Rash (linear vesicular rash on hands, face) noted.  Psychiatric:       Flat affect, slowed speech          Assessment & Plan:

## 2011-01-08 NOTE — Assessment & Plan Note (Signed)
Hx/ exam consistent with rhus dermatitis.  Will treat with oral prednisone taper, RX Hydroxyzine at night for pruritis and topical caladryl cream.  Avoid scratching.  Call if not resolved in 7-10 days.

## 2011-01-08 NOTE — Assessment & Plan Note (Signed)
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  She has had slurred speech, flat affect and 'mental slowing'  Which has kept her out of work.  I do not have any psych notes and I will talk to Dr Lenise Arena, her neurologist about her cause.  She is interested in persuing a w/u with neuropysch.

## 2011-01-28 ENCOUNTER — Telehealth: Payer: Self-pay | Admitting: Family Medicine

## 2011-01-28 DIAGNOSIS — M5136 Other intervertebral disc degeneration, lumbar region: Secondary | ICD-10-CM

## 2011-01-28 NOTE — Telephone Encounter (Signed)
Patient is requesting a referral re: her lumbar issues with L4 and buldging disk and is a lot of pain. You can advise her by calling her back at [phone]. Thanks, [employer]

## 2011-01-28 NOTE — Telephone Encounter (Signed)
Done

## 2011-02-06 ENCOUNTER — Ambulatory Visit (INDEPENDENT_AMBULATORY_CARE_PROVIDER_SITE_OTHER): Payer: BC Managed Care – PPO | Admitting: Family Medicine

## 2011-02-06 ENCOUNTER — Encounter: Payer: Self-pay | Admitting: Family Medicine

## 2011-02-06 DIAGNOSIS — M549 Dorsalgia, unspecified: Secondary | ICD-10-CM

## 2011-02-06 DIAGNOSIS — F339 Major depressive disorder, recurrent, unspecified: Secondary | ICD-10-CM

## 2011-02-06 MED ORDER — ALPRAZOLAM 1 MG PO TABS: 1.0000 mg | ORAL_TABLET | Freq: Every evening | ORAL | Status: DC | PRN

## 2011-02-06 MED ORDER — MUPIROCIN 2 % EX OINT: TOPICAL_OINTMENT | CUTANEOUS | Status: DC

## 2011-02-06 NOTE — Assessment & Plan Note (Signed)
Chronic neck and back pain with recent MRIs per Dr Lenise Arena.  She is not intersted in referral to pain clinic at this time and per his note, he did not think she needed surgery.  She declined PT due to cost.  She is looking to get Short term disability based on these dx.

## 2011-02-06 NOTE — Assessment & Plan Note (Signed)
Cassandra Ford's mood seems better today and I assume this is because she's now on Depakote for concurrent migraines.  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  She is no longer stuttering and is thinking more clearly.  I explained that her chronic pain plays a part into her mood.  Continue depakote with prn xanax once daily, RFd today.

## 2011-02-06 NOTE — Progress Notes (Signed)
  Subjective:    Patient ID: Cassandra Ford, female    DOB: [birth year], 50 y.o.   MRN: [id number]  HPI 50 yo WF presents f/u visit.  She was seeing Dr Gaynell Face for psychiatric care but she tells me that he told her she was 'normal'.  She suffers from chornic migraines -- sees Dr Lenise Arena.  Last month, he started her on Topomax but it didn't work so she was changed to Depakote a wk ago.  She is starting to feel better.  She still has neck and back pain with findings of buldging discs on MRI thru Dr Lenise Arena.  She is upset about her diagnosis of depression, though she has been clinically depressed the past 2 yrs with somatization d/o.  She is in denial about this and is mostly concerned about getting back to work and finances.  BP 130/87  Pulse 106  Ht 5\' 6"  (1.676 m)  Wt 218 lb (98.884 kg)  BMI 35.19 kg/m2  SpO2 96%    Review of Systems  Constitutional: Positive for fatigue. Negative for fever.  HENT: Negative for neck stiffness.   Respiratory: Negative for shortness of breath.   Musculoskeletal: Positive for back pain and arthralgias.  Skin: Positive for wound (sores on extremities, under chin x months.).  Neurological: Positive for headaches (improving).  Psychiatric/Behavioral: Negative for suicidal ideas, sleep disturbance, self-injury and dysphoric mood. The patient is nervous/anxious.        Objective:   Physical Exam  Constitutional: She appears well-developed and well-nourished. No distress.       Obese.  Speech normal today  HENT:  Head: Normocephalic and atraumatic.  Eyes: Conjunctivae are normal. No scleral icterus.  Neck: Neck supple.  Cardiovascular: Normal rate, regular rhythm and normal heart sounds.   Pulmonary/Chest: Effort normal and breath sounds normal.  Musculoskeletal: She exhibits no edema.  Lymphadenopathy:    She has no cervical adenopathy.  Skin: Skin is warm and dry.       Chronic 'sores' under chin and on extremities  Psychiatric: She has a normal mood  and affect. Her behavior is normal. Thought content normal.       Mood is much improved today          Assessment & Plan:

## 2011-02-06 NOTE — Patient Instructions (Signed)
Stay on Depakote and f/u with Dr Lenise Arena.  You can try to [employer] paperwork based on MRI findings-- ask Dr Lenise Arena.  F/u back doctor for f/u.  Xanax RFd.  Use Bactroban ointment on sores.

## 2011-02-12 ENCOUNTER — Other Ambulatory Visit: Payer: Self-pay | Admitting: *Deleted

## 2011-02-12 MED ORDER — RIZATRIPTAN BENZOATE 10 MG PO TABS: 10.0000 mg | ORAL_TABLET | ORAL | Status: DC | PRN

## 2011-03-12 ENCOUNTER — Other Ambulatory Visit: Payer: Self-pay | Admitting: *Deleted

## 2011-03-12 MED ORDER — ALPRAZOLAM 1 MG PO TABS: 1.0000 mg | ORAL_TABLET | Freq: Every evening | ORAL | Status: DC | PRN

## 2011-04-03 ENCOUNTER — Other Ambulatory Visit: Payer: Self-pay | Admitting: Family Medicine

## 2011-04-04 ENCOUNTER — Other Ambulatory Visit: Payer: Self-pay | Admitting: Family Medicine

## 2011-04-04 NOTE — Telephone Encounter (Signed)
Pt notified will not refill the alprazolam at this time.  Will refill 04-09-11 or after.  Pt voiced understanding. Jarvis Newcomer, LPN [facility]

## 2011-04-04 NOTE — Telephone Encounter (Signed)
It is pt's responsibility to not take more than the prescribed amount of controlled substances.  Will not RF her until 6-4.

## 2011-04-04 NOTE — Telephone Encounter (Signed)
Pt called and needs a refill of her alprazolam.  She had a back spinal injection this week and had to take extra med to get her through the procedure because she was in so much pain.  ALso had a migraine for two weeks and she had no one at home with her to help her and she thinks she accidentally spilled some (a couple of her alprazolam on the floor and has no idea where they are).   Has two pills left and should have 5.  Please advise if okay to fill early.  Technically not due til 04-11-11.   Jarvis Newcomer, LPN [facility]

## 2011-04-10 ENCOUNTER — Other Ambulatory Visit: Payer: Self-pay | Admitting: *Deleted

## 2011-04-10 MED ORDER — ALPRAZOLAM 1 MG PO TABS: 1.0000 mg | ORAL_TABLET | Freq: Every evening | ORAL | Status: DC | PRN

## 2011-04-15 ENCOUNTER — Encounter: Payer: Self-pay | Admitting: Family Medicine

## 2011-04-15 ENCOUNTER — Ambulatory Visit (INDEPENDENT_AMBULATORY_CARE_PROVIDER_SITE_OTHER): Payer: BC Managed Care – PPO | Admitting: Family Medicine

## 2011-04-15 DIAGNOSIS — Z13220 Encounter for screening for lipoid disorders: Secondary | ICD-10-CM

## 2011-04-15 DIAGNOSIS — R5383 Other fatigue: Secondary | ICD-10-CM

## 2011-04-15 DIAGNOSIS — M51369 Other intervertebral disc degeneration, lumbar region without mention of lumbar back pain or lower extremity pain: Secondary | ICD-10-CM

## 2011-04-15 DIAGNOSIS — M5136 Other intervertebral disc degeneration, lumbar region: Secondary | ICD-10-CM

## 2011-04-15 DIAGNOSIS — Z1329 Encounter for screening for other suspected endocrine disorder: Secondary | ICD-10-CM

## 2011-04-15 DIAGNOSIS — R635 Abnormal weight gain: Secondary | ICD-10-CM

## 2011-04-15 DIAGNOSIS — M5137 Other intervertebral disc degeneration, lumbosacral region: Secondary | ICD-10-CM

## 2011-04-15 DIAGNOSIS — Z130 Encounter for screening for diseases of the blood and blood-forming organs and certain disorders involving the immune mechanism: Secondary | ICD-10-CM

## 2011-04-15 DIAGNOSIS — G43009 Migraine without aura, not intractable, without status migrainosus: Secondary | ICD-10-CM

## 2011-04-15 MED ORDER — DIAZEPAM 5 MG PO TABS: 5.0000 mg | ORAL_TABLET | Freq: Every evening | ORAL | Status: AC | PRN

## 2011-04-15 NOTE — Patient Instructions (Signed)
Work on low sugar/ low carb diet with increased physical activity. Avoid highly concentrated sweets.  Update fasting labs one morning. Will call you w/ results.  F/u with Dr Alden Hipp and Dr Lenise Arena.  Change Xanax to Valium at bedtime.  Do not take them together.  Return for f/u in 4mos.

## 2011-04-15 NOTE — Assessment & Plan Note (Signed)
Chronic daily HA, worsened by cervical DDD.  She is seeing Dr Lenise Arena.  I did RF her Maxalt today and gave her samples.  Advised there to let Dr Lenise Arena know about any problems with Keppra since it will help w/ migraine prevention.  Her C spine triggers are being managed by Dr Alden Hipp.

## 2011-04-15 NOTE — Assessment & Plan Note (Addendum)
Had recent L spine MRI and has had 1 LESI with Dr Alden Hipp.  Still havingpain and weakness and realizes that a long term plan for exercise and wt loss is important.  I did change her Alprazolam to Valium (to use instead of flexeril) at night for both sleep and muscle spasm.

## 2011-04-15 NOTE — Progress Notes (Signed)
  Subjective:    Patient ID: Cassandra Ford, female    DOB: 1961/09/21, 50 y.o.   MRN: [id number]  HPI  50 yo WF presents for f/u visit.  She is currently seeing Dr Alden Hipp at [facility] and Dr Lenise Arena (neuro) for her chronic migraines and C and L spine DDD.  She's already had a cervical fusion and suffers from chronic R sided neck pain and daily HAs.  By her last neurology note, it looks like her depakote was tapered and Keppra was added. She admits to not taking it everyday, party b/c of the noticeable memory loss that she gets from it.  Her mood has started to improve.  She is upset about her weight gain.  Her exercise is limited due to back and leg pain/ weakness.  Her has gone for 1 LESI thus far w/o noteable difference.  She has f/u with both doctors in the next 2 wks.    BP 115/74  Pulse 102  Wt 224 lb (101.606 kg)  SpO2 98%   Review of Systems  Constitutional: Positive for fatigue and unexpected weight change. Negative for fever and chills.  HENT: Negative for sinus pressure.   Respiratory: Negative for shortness of breath.   Cardiovascular: Negative for chest pain and palpitations.  Gastrointestinal: Positive for nausea and vomiting. Negative for constipation.  Musculoskeletal: Positive for myalgias and back pain.  Neurological: Positive for weakness and headaches. Negative for dizziness.  Psychiatric/Behavioral: Positive for sleep disturbance and decreased concentration. Negative for dysphoric mood. The patient is not nervous/anxious.        Objective:   Physical Exam  Constitutional: She appears well-developed and well-nourished. No distress.  HENT:  Mouth/Throat: Oropharynx is clear and moist.  Eyes: EOM are normal.  Neck: Neck supple.       Globally reduced active c spine ROM  Cardiovascular: Normal rate, regular rhythm and normal heart sounds.   No murmur heard. Pulmonary/Chest: Effort normal and breath sounds normal.  Musculoskeletal: She exhibits no edema.  Neurological:  She has normal reflexes.  Skin: Skin is warm and dry.  Psychiatric: She has a normal mood and affect.          Assessment & Plan:

## 2011-05-01 ENCOUNTER — Telehealth: Payer: Self-pay | Admitting: Family Medicine

## 2011-05-01 LAB — COMPLETE METABOLIC PANEL WITH GFR
ALT: 24 U/L (ref 0–35)
AST: 25 U/L (ref 0–37)
Albumin: 4.3 g/dL (ref 3.5–5.2)
Alkaline Phosphatase: 82 U/L (ref 39–117)
BUN: 10 mg/dL (ref 6–23)
CO2: 29 mEq/L (ref 19–32)
Chloride: 104 mEq/L (ref 96–112)
GFR, Est African American: >60 mL/min (ref >60)
GFR, Est Non African American: >60 mL/min (ref >60)
Glucose, Bld: 89 mg/dL (ref 70–99)
Potassium: 5 mEq/L (ref 3.5–5.3)

## 2011-05-01 LAB — CBC WITH DIFFERENTIAL/PLATELET
Basophils Absolute: 0 10*3/uL (ref 0.0–0.1)
Basophils Relative: 0 % (ref 0–1)
Eosinophils Absolute: 0.1 10*3/uL (ref 0.0–0.7)
HCT: 48.7 % — ABNORMAL HIGH (ref 36.0–46.0)
Hemoglobin: 16.6 g/dL — ABNORMAL HIGH (ref 12.0–15.0)
Lymphocytes Relative: 37 % (ref 12–46)
Lymphs Abs: 2.7 10*3/uL (ref 0.7–4.0)
MCH: 29.4 pg (ref 26.0–34.0)
MCHC: 34.1 g/dL (ref 30.0–36.0)
Monocytes Absolute: 0.6 10*3/uL (ref 0.1–1.0)
Monocytes Relative: 9 % (ref 3–12)
Platelets: 373 10*3/uL (ref 150–400)
RDW: 14.8 % (ref 11.5–15.5)

## 2011-05-01 LAB — TSH: TSH: 1.994 u[IU]/mL (ref 0.350–4.500)

## 2011-05-01 LAB — LIPID PANEL: Triglycerides: 124 mg/dL (ref <150)

## 2011-05-01 NOTE — Telephone Encounter (Signed)
Pls let pt know that her blood counts, Vitamin D, B12 and cholesterol look great.  Fasting sguar, liver and kidney function and thryoid function also normal.  Repeat in 1 yr.

## 2011-05-01 NOTE — Telephone Encounter (Signed)
Advised pt of results.

## 2011-05-09 ENCOUNTER — Other Ambulatory Visit: Payer: Self-pay | Admitting: *Deleted

## 2011-05-13 ENCOUNTER — Telehealth: Payer: Self-pay | Admitting: Family Medicine

## 2011-05-13 ENCOUNTER — Other Ambulatory Visit: Payer: Self-pay | Admitting: Family Medicine

## 2011-05-13 MED ORDER — ALPRAZOLAM 1 MG PO TABS
1.0000 mg | ORAL_TABLET | Freq: Every evening | ORAL | Status: DC | PRN
Start: 2011-05-13 — End: 2011-06-12

## 2011-05-13 NOTE — Telephone Encounter (Signed)
Pt calling for refill of alprazolam. Plan:  Reviewed the chart file and notified the pt that the script had already been sent today earlier. Jarvis Newcomer, LPN [facility]

## 2011-05-13 NOTE — Telephone Encounter (Signed)
RF done. .

## 2011-06-11 ENCOUNTER — Other Ambulatory Visit: Payer: Self-pay | Admitting: *Deleted

## 2011-06-11 MED ORDER — RIZATRIPTAN BENZOATE 10 MG PO TABS: 10.0000 mg | ORAL_TABLET | ORAL | Status: DC | PRN

## 2011-06-12 ENCOUNTER — Other Ambulatory Visit: Payer: Self-pay | Admitting: *Deleted

## 2011-06-12 MED ORDER — ALPRAZOLAM 1 MG PO TABS: 1.0000 mg | ORAL_TABLET | Freq: Every evening | ORAL | Status: DC | PRN

## 2011-06-13 ENCOUNTER — Other Ambulatory Visit: Payer: Self-pay | Admitting: *Deleted

## 2011-07-09 ENCOUNTER — Other Ambulatory Visit: Payer: Self-pay | Admitting: Family Medicine

## 2011-07-11 ENCOUNTER — Other Ambulatory Visit: Payer: Self-pay | Admitting: *Deleted

## 2011-07-11 MED ORDER — ALPRAZOLAM 1 MG PO TABS: 1.0000 mg | ORAL_TABLET | Freq: Every evening | ORAL | Status: DC | PRN

## 2011-07-15 ENCOUNTER — Encounter: Payer: Self-pay | Admitting: Family Medicine

## 2011-07-15 ENCOUNTER — Telehealth: Payer: Self-pay | Admitting: Family Medicine

## 2011-07-15 ENCOUNTER — Ambulatory Visit (INDEPENDENT_AMBULATORY_CARE_PROVIDER_SITE_OTHER): Payer: BC Managed Care – PPO | Admitting: Family Medicine

## 2011-07-15 VITALS — BP 128/83 | HR 73 | Wt 216.0 lb

## 2011-07-15 DIAGNOSIS — R202 Paresthesia of skin: Secondary | ICD-10-CM

## 2011-07-15 DIAGNOSIS — R209 Unspecified disturbances of skin sensation: Secondary | ICD-10-CM

## 2011-07-15 DIAGNOSIS — M542 Cervicalgia: Secondary | ICD-10-CM

## 2011-07-15 NOTE — Progress Notes (Signed)
  Subjective:    Patient ID: Cassandra Ford, female    DOB: 1960/11/14, 50 y.o.   MRN: [id number]  HPI In the last through weeks has been getting stabbing pains in both legs. Has also been getting numbness and tingling in her arms and legs.  Dr.David Tyrell Antonio is her neurologist.  Dr. Alden Hipp [phone]) is an orthopedic specialty wanted her to do PT but says she can't afford this. Hx of bulging disc in her neck and had repair in 2003. Didn't really help.  Says her pain has been getting worse since then.  Has had MRIs a couple of mo ago. Does have some additional bulging disc above and below the previously repaired disc.   Says her pain is so severe she vomits at times, but doesn't want to take pain medications.  Has hx of chronic hA and migraines 3 days per week. She says her neurologist is going to restart Topamax which he is a retried in the past. She says she's tried multiple medications in the past. Her disability runs out at the end of this month.  She would really like to go back to work if possible. She wants me to write a letter saying it is okay for her to return. She says she saw Dr. Gaynell Face (psych ) and she says he felt she was fine, psychologically.  She is waking up every every hour to 1.5 hours.  Not sleeping well.     Review of Systems     Objective:   Physical Exam  Constitutional: She is oriented to person, place, and time. She appears well-developed and well-nourished.  HENT:  Head: Normocephalic and atraumatic.  Cardiovascular: Normal rate, regular rhythm and normal heart sounds.   Pulmonary/Chest: Effort normal and breath sounds normal.  Musculoskeletal:       Neck with dec flexion, extension, rotation right and left and side bending.  Patellar reflexes 1+ in the UE and LE.  Hip, knee, and ankles strength 5/5.  shoulder with dec ROM, hard time reach above her low back. Full extension of shoulders. Shoulder and elbow with 5/5 strength   Neurological: She is alert and oriented to  person, place, and time.  Skin: Skin is warm and dry.  Psychiatric: She has a normal mood and affect. Her behavior is normal.          Assessment & Plan:  As far as the numbness in her legs and arms I recommended she followup with her orthopedist and her neurologist. This evidently is not new and she has had some recent MRIs done of her head.  As far as going back to work I am not sure about this I explained to her that we would need to contact her specialist to see if they felt that she could return to work as her major limiting factors are her migraines and her back pain, neither of which do I treat. I also explained to her that based on her current symptoms I am not sure she would be a great candidate for work but she feels that going back to work really help her mentally. I let her know that we would get in touch with both of her specialist to see if they felt she was a candidate to return to work.  25 minutes spent face-to-face discussion about her concerns.

## 2011-07-15 NOTE — Telephone Encounter (Signed)
KIM will you call Dr.David Tyrell Antonio,  her neurologist and Dr. Alden Hipp [phone]) is an orthopedic specialty to see if they feel she is okay enough to return to work. Please let them know that she came in to the office yesterday wanting a note saying that she can return to work at the end of the month. Evidently she has been on disability through work. I explained to her that I don't treat her neck pain or her neurologic conditions such as her migraines and therefore I cannot attest to whether or not she is a candidate to return to work. If they both feel that she is okay and I can certainly write a letter stating that her specialists feel she is okay to return to work.

## 2011-07-16 NOTE — Telephone Encounter (Signed)
I called Dr. Lenise Arena and spoke with Maralyn Sago his assisstant and she spoke with Dr. Izola Price and he is seeing the patient on 08/04/2011 and will evaluate and if thinks she is able to go back to work he will provide her with the letter

## 2011-09-08 ENCOUNTER — Other Ambulatory Visit: Payer: Self-pay | Admitting: *Deleted

## 2011-09-08 MED ORDER — ALPRAZOLAM 1 MG PO TABS: 1.0000 mg | ORAL_TABLET | Freq: Every evening | ORAL | Status: DC | PRN

## 2011-10-07 ENCOUNTER — Other Ambulatory Visit: Payer: Self-pay | Admitting: Family Medicine

## 2011-11-06 ENCOUNTER — Other Ambulatory Visit: Payer: Self-pay | Admitting: *Deleted

## 2011-11-06 MED ORDER — ALPRAZOLAM 1 MG PO TABS: 1.0000 mg | ORAL_TABLET | Freq: Every evening | ORAL | Status: DC | PRN

## 2011-11-17 ENCOUNTER — Other Ambulatory Visit: Payer: Self-pay | Admitting: *Deleted

## 2011-12-08 ENCOUNTER — Other Ambulatory Visit: Payer: Self-pay | Admitting: *Deleted

## 2011-12-08 MED ORDER — ALPRAZOLAM 1 MG PO TABS: 1.0000 mg | ORAL_TABLET | Freq: Every evening | ORAL | Status: DC | PRN

## 2011-12-09 ENCOUNTER — Other Ambulatory Visit: Payer: Self-pay | Admitting: *Deleted

## 2011-12-09 MED ORDER — ALPRAZOLAM 1 MG PO TABS: 1.0000 mg | ORAL_TABLET | Freq: Every evening | ORAL | Status: DC | PRN

## 2012-01-06 ENCOUNTER — Other Ambulatory Visit: Payer: Self-pay | Admitting: *Deleted

## 2012-01-06 MED ORDER — ALPRAZOLAM 1 MG PO TABS: 1.0000 mg | ORAL_TABLET | Freq: Every evening | ORAL | Status: DC | PRN

## 2012-02-09 ENCOUNTER — Ambulatory Visit (INDEPENDENT_AMBULATORY_CARE_PROVIDER_SITE_OTHER): Payer: BC Managed Care – PPO | Admitting: Family Medicine

## 2012-02-09 ENCOUNTER — Ambulatory Visit
Admission: RE | Admit: 2012-02-09 | Discharge: 2012-02-09 | Disposition: A | Discharge status: Home / Self Care | Payer: BC Managed Care – PPO | Source: Ambulatory Visit | Attending: Family Medicine | Admitting: Family Medicine

## 2012-02-09 ENCOUNTER — Encounter: Payer: Self-pay | Admitting: Family Medicine

## 2012-02-09 VITALS — BP 122/77 | HR 99 | Ht 66.0 in | Wt 195.0 lb

## 2012-02-09 DIAGNOSIS — R634 Abnormal weight loss: Secondary | ICD-10-CM

## 2012-02-09 DIAGNOSIS — G43909 Migraine, unspecified, not intractable, without status migrainosus: Secondary | ICD-10-CM

## 2012-02-09 DIAGNOSIS — Z13220 Encounter for screening for lipoid disorders: Secondary | ICD-10-CM

## 2012-02-09 DIAGNOSIS — J069 Acute upper respiratory infection, unspecified: Secondary | ICD-10-CM

## 2012-02-09 DIAGNOSIS — R05 Cough: Secondary | ICD-10-CM

## 2012-02-09 DIAGNOSIS — J309 Allergic rhinitis, unspecified: Secondary | ICD-10-CM

## 2012-02-09 MED ORDER — ALPRAZOLAM 1 MG PO TABS: 1.0000 mg | ORAL_TABLET | Freq: Every evening | ORAL | Status: DC | PRN

## 2012-02-09 NOTE — Progress Notes (Signed)
Subjective:    Patient ID: Cassandra Ford, female    DOB: 1960/11/03, 51 y.o.   MRN: [id number]  HPI Woke up this Am with cough, ST, and drianage. Tenderness over all her sinuses.  She is not taking her allergy medication. Eyes watering.  Maybe fever this AM she did not check her temperature but says she did feel hot. No chills..  Says she hasn't had anything to eat or take soday. Dec appetite.  Sounds feel kind of like her Migraine.  Says vision has been more blurry the last few weeks. Cough for several months, dry.  Turgor was bothersome and she said no. She was unable to tell me if she had any mucus production at all. She has not had any fevers with it. She denies any shortness of breath. She was unable to tell me if she noticed any wheezing. She says her mother is the one who commented about her coughing. Has lost about 30 lbs in a short period of time. I asked her she didn't having any allergy symptoms before the sore throat and cough that started today and she never answered the question. She did say she tried taking some old Zyrtec but it expired a couple weeks ago.  Anxiety-Still taking xanax for panic attacks.  Says her pain triggers her anxiety.  Unable to really tell him how often she is using her xanax.     Review of Systems     Objective:   Physical Exam  Constitutional: She is oriented to person, place, and time. She appears well-developed and well-nourished.  HENT:  Head: Normocephalic and atraumatic.  Right Ear: External ear normal.  Left Ear: External ear normal.  Nose: Nose normal.  Mouth/Throat: Oropharynx is clear and moist.       TMs and canals are clear.   Eyes: Conjunctivae and EOM are normal. Pupils are equal, round, and reactive to light.  Neck: Neck supple. No thyromegaly present.       Tender shoddy cervical lymph nodes bilaterally.  Cardiovascular: Normal rate, regular rhythm and normal heart sounds.   Pulmonary/Chest: Effort normal and breath sounds normal.  She has no wheezes.  Lymphadenopathy:    She has cervical adenopathy.  Neurological: She is alert and oriented to person, place, and time.  Skin: Skin is warm and dry.  Psychiatric: She has a normal mood and affect.          Assessment & Plan:  URI likely cold symptoms. Continue symptomatic care. I do think this may be complicated by her allergies I did recommend she restart her Zyrtec. She can certainly try the version without the decongestant and this may help with some of the excess drying but she complains of. If she's not better in about a week and please give the office a call or if she feels she is getting worse this week and please call the office sooner rather than later. She can call she runs a fever.  Allergic rhinitis-I recommend she go ahead and start her antihistamine to better control her symptoms. I do think she may have a URI as well.  Anxiety - overall I think she is doing fair with her anxiety. Do not think she's had any recent exacerbations. Will refill her Xanax 30 tablets per month with one refill. Followup in 6 months.  Cough x2-3 month-will get chest x-rays and he'll have a great explanation for her cough. She is not on ACE inhibitor. Consider reflux as a possibility. Also consider  this could be allergy related so I would like her to start her antihistamine back.  She is fasting today would like to go ahead and check her cholesterol.  She also complains of weight loss without the aid of dieting and exercise, of about the same time frame as the cough, approximately 2-3 months. We will check her TSH.

## 2012-02-09 NOTE — Patient Instructions (Signed)
Call me if not better in one week REstart your zyrtec.

## 2012-02-10 LAB — CBC WITH DIFFERENTIAL/PLATELET
Basophils Absolute: 0 10*3/uL (ref 0.0–0.1)
Basophils Relative: 0 % (ref 0–1)
Eosinophils Absolute: 0.1 10*3/uL (ref 0.0–0.7)
HCT: 46.1 % — ABNORMAL HIGH (ref 36.0–46.0)
MCH: 29.4 pg (ref 26.0–34.0)
MCHC: 34.1 g/dL (ref 30.0–36.0)
Monocytes Absolute: 0.6 10*3/uL (ref 0.1–1.0)
Neutro Abs: 5.6 10*3/uL (ref 1.7–7.7)
Neutrophils Relative %: 58 % (ref 43–77)
RDW: 14.8 % (ref 11.5–15.5)

## 2012-02-10 LAB — COMPLETE METABOLIC PANEL WITH GFR
ALT: 37 U/L — ABNORMAL HIGH (ref 0–35)
AST: 33 U/L (ref 0–37)
Albumin: 4.8 g/dL (ref 3.5–5.2)
CO2: 22 mEq/L (ref 19–32)
Calcium: 10.1 mg/dL (ref 8.4–10.5)
Chloride: 107 mEq/L (ref 96–112)
Creat: 0.96 mg/dL (ref 0.50–1.10)
GFR, Est African American: 80 mL/min
Potassium: 4.1 mEq/L (ref 3.5–5.3)
Sodium: 143 mEq/L (ref 135–145)
Total Protein: 7.6 g/dL (ref 6.0–8.3)

## 2012-02-10 LAB — LIPID PANEL
LDL Cholesterol: 116 mg/dL — ABNORMAL HIGH (ref 0–99)
Triglycerides: 126 mg/dL (ref <150)
VLDL: 25 mg/dL (ref 0–40)

## 2012-02-10 LAB — TSH: TSH: 1.229 u[IU]/mL (ref 0.350–4.500)

## 2012-04-07 ENCOUNTER — Other Ambulatory Visit: Payer: Self-pay | Admitting: *Deleted

## 2012-04-07 MED ORDER — ALPRAZOLAM 1 MG PO TABS: 1.0000 mg | ORAL_TABLET | Freq: Every evening | ORAL | Status: DC | PRN

## 2012-05-10 ENCOUNTER — Other Ambulatory Visit: Payer: Self-pay | Admitting: *Deleted

## 2012-05-10 MED ORDER — ALPRAZOLAM 1 MG PO TABS: 1.0000 mg | ORAL_TABLET | Freq: Every evening | ORAL | Status: DC | PRN

## 2012-06-11 ENCOUNTER — Encounter: Payer: Self-pay | Admitting: Family Medicine

## 2012-06-11 ENCOUNTER — Ambulatory Visit (INDEPENDENT_AMBULATORY_CARE_PROVIDER_SITE_OTHER): Payer: BC Managed Care – PPO | Admitting: Family Medicine

## 2012-06-11 VITALS — BP 130/79 | HR 85 | Wt 192.0 lb

## 2012-06-11 DIAGNOSIS — F411 Generalized anxiety disorder: Secondary | ICD-10-CM

## 2012-06-11 DIAGNOSIS — R748 Abnormal levels of other serum enzymes: Secondary | ICD-10-CM

## 2012-06-11 DIAGNOSIS — L659 Nonscarring hair loss, unspecified: Secondary | ICD-10-CM

## 2012-06-11 LAB — HEPATIC FUNCTION PANEL
ALT: 25 U/L (ref 0–35)
AST: 24 U/L (ref 0–37)
Albumin: 4.6 g/dL (ref 3.5–5.2)
Alkaline Phosphatase: 75 U/L (ref 39–117)
Total Bilirubin: 0.3 mg/dL (ref 0.3–1.2)

## 2012-06-11 MED ORDER — ALPRAZOLAM 1 MG PO TABS: 1.0000 mg | ORAL_TABLET | Freq: Every evening | ORAL | Status: DC | PRN

## 2012-06-11 NOTE — Addendum Note (Signed)
Addended by: Nani Gasser D on: 06/11/2012 10:49 AM   Modules accepted: Orders

## 2012-06-11 NOTE — Progress Notes (Signed)
  Subjective:    Patient ID: Cassandra Ford, female    DOB: 1961-07-26, 50 y.o.   MRN: [id number]  HPI Standing on her legs she gets broken blood vessels and then they break.  Wants to know what to do.    Has had some hairloss. She wants to know if she can take Biotin.  Says she it started coming out when she was on the sertraline.  Feels it really hasn't come back in.   Lab Results  Component Value Date   TSH 1.229 02/09/2012   Anxiety - She is doing well.  Needs xanx refills. She ran out.  Having problems getting her refills at the pharmacy.    Review of Systems     Objective:   Physical Exam  Constitutional: She is oriented to person, place, and time. She appears well-developed and well-nourished.  HENT:  Head: Normocephalic and atraumatic.  Cardiovascular: Normal rate, regular rhythm and normal heart sounds.   Pulmonary/Chest: Effort normal and breath sounds normal.  Neurological: She is alert and oriented to person, place, and time.  Skin: Skin is warm and dry.  Psychiatric: She has a normal mood and affect. Her behavior is normal.          Assessment & Plan:  Broken blood vessels- Discussed use of compression stockings.  She can certainly get them from [facility] which is local pharmacy here in town. Moderate grade would be adequate. Levemir also be adequate.  Anxiety - Doing well. Will refill her meds. Prescription sent with refills. Followup in 6 months.  Hairloss - she wants and if it's okay to try biotin. This is fine. Should not interact with her medication she's not any blood thinners. Her thyroid was normal.  Abnormal liver ezyme- Recheck today.

## 2012-10-06 ENCOUNTER — Other Ambulatory Visit: Payer: Self-pay | Admitting: Family Medicine

## 2012-10-09 ENCOUNTER — Other Ambulatory Visit: Payer: Self-pay | Admitting: Family Medicine

## 2012-10-20 ENCOUNTER — Telehealth: Payer: Self-pay

## 2012-10-20 MED ORDER — OSELTAMIVIR PHOSPHATE 75 MG PO CAPS: 75.0000 mg | ORAL_CAPSULE | Freq: Every day | ORAL | Status: DC

## 2012-10-20 NOTE — Telephone Encounter (Signed)
Call pt and let know sent for 14 days once a day.

## 2012-10-20 NOTE — Telephone Encounter (Signed)
Patient advised.

## 2012-10-20 NOTE — Telephone Encounter (Signed)
Lashala states she is fatigued and her coworker tested positive for the flu. She states she has a lower immune system and would like Tamiflu. [facility] [place] [address].

## 2012-11-15 ENCOUNTER — Ambulatory Visit (INDEPENDENT_AMBULATORY_CARE_PROVIDER_SITE_OTHER): Payer: BC Managed Care – PPO | Admitting: Physician Assistant

## 2012-11-15 ENCOUNTER — Encounter: Payer: Self-pay | Admitting: Physician Assistant

## 2012-11-15 VITALS — BP 135/78 | HR 105 | Wt 177.0 lb

## 2012-11-15 DIAGNOSIS — R5383 Other fatigue: Secondary | ICD-10-CM

## 2012-11-15 DIAGNOSIS — R21 Rash and other nonspecific skin eruption: Secondary | ICD-10-CM

## 2012-11-15 MED ORDER — VALACYCLOVIR HCL 1 G PO TABS: 1000.0000 mg | ORAL_TABLET | Freq: Three times a day (TID) | ORAL | Status: DC

## 2012-11-15 MED ORDER — LIDOCAINE 5 % EX PTCH: 1.0000 | MEDICATED_PATCH | CUTANEOUS | Status: DC

## 2012-11-15 NOTE — Patient Instructions (Addendum)
Ice will help decrease inflammation.   Valtrex three times a day.  Lidoderm 1 patch daily up to 12 hours.  Ibuprofen 800mg  up to three times a day.  Call if not improving.   Shingles Shingles is caused by the same virus that causes chickenpox (varicella zoster virus or VZV). Shingles often occurs many years or decades after having chickenpox. That is why it is more common in adults older than 51 years. The virus reactivates and breaks out as an infection in a nerve root. SYMPTOMS   The initial feeling (sensations) may be pain. This pain is usually described as:  Burning.  Stabbing.  Throbbing.  Tingling in the nerve root.  A red rash will follow in a couple days. The rash may occur in any area of the body and is usually on one side (unilateral) of the body in a band or belt-like pattern. The rash usually starts out as very small blisters (vesicles). They will dry up after 7 to 10 days. This is not usually a significant problem except for the pain it causes.  Long-lasting (chronic) pain is more likely in an elderly person. It can last months to years. This condition is called postherpetic neuralgia. Shingles can be an extremely severe infection in someone with AIDS, a weakened immune system, or with forms of leukemia. It can also be severe if you are taking transplant medicines or other medicines that weaken the immune system. TREATMENT  Your caregiver will often treat you with:  Antiviral drugs.  Anti-inflammatory drugs.  Pain medicines. Bed rest is very important in preventing the pain associated with herpes zoster (postherpetic neuralgia). Application of heat in the form of a hot water bottle or electric heating pad or gentle pressure with the hand is recommended to help with the pain or discomfort. PREVENTION  A varicella zoster vaccine is available to help protect against the virus. The Food and Drug Administration approved the varicella zoster vaccine for individuals 51 years  of age and older. HOME CARE INSTRUCTIONS   Cool compresses to the area of rash may be helpful.  Only take over-the-counter or prescription medicines for pain, discomfort, or fever as directed by your caregiver.  Avoid contact with:  Babies.  Pregnant women.  Children with eczema.  Elderly people with transplants.  People with chronic illnesses, such as leukemia and AIDS.  If the area involved is on your face, you may receive a referral for follow-up to a specialist. It is very important to keep all follow-up appointments. This will help avoid eye complications, chronic pain, or disability. SEEK IMMEDIATE MEDICAL CARE IF:   You develop any pain (headache) in the area of the face or eye. This must be followed carefully by your caregiver or ophthalmologist. An infection in part of your eye (cornea) can be very serious. It could lead to blindness.  You do not have pain relief from prescribed medicines.  Your redness or swelling spreads.  The area involved becomes very swollen and painful.  You have a fever.  You notice any red or painful lines extending away from the affected area toward your heart (lymphangitis).  Your condition is worsening or has changed. Document Released: 09/22/2005 Document Revised: 12/15/2011 Document Reviewed: 08/27/2009 [facility] Patient Information 2013 [place], [place].

## 2012-11-15 NOTE — Progress Notes (Signed)
  Subjective:    Patient ID: Cassandra Ford, female    DOB: Feb 20, 1961, 52 y.o.   MRN: [id number]  HPI Patient presents to the clinic with 3 painful spots under left breast and pain to the touch on her left mid back. She noticed the small spots under her breast 6 days ago. She put neosporin on them and burned a little but has not helped.Her boyfriend 3 days ago went to touch her back and it was extremely painful to touch. She has not noticed any rash or skin lesions on her back. She has not had any recent travel and has not noticed any bug bites. She has not ran a fever, chills, muscle aches. She does feel nauseated but that is something that is off and on for her. She is exercising and drinks a lot of water. She denies any well water. She has been more fatigued lately. She has been under good stress lately but her boyfriend wants to marry her and she wants to but is scared. She has been overwhelmed with proposal lately.    Review of Systems     Objective:   Physical Exam  Constitutional: She appears well-developed and well-nourished.  Cardiovascular: Regular rhythm and normal heart sounds.   Tachycardia at 105.  Musculoskeletal:  Extremely tender to light palpation over left mid-back.   Skin: Skin is dry.  2 1 cm closed patches  of erythema that were slightly raised and looked like there could be developing a vesicle formation on top under left breast.  No lesions on left back.          Assessment & Plan:  RASh- Clinical history and only left side rash/pain sounds suspicious for shingles. Will give anti-viral to start taking. Also gave some lidoderm patches to try over most tender areas. Pt cannot tolerate NSAIDS due to her stomach so encourage her to take tylenol. Will get CBC just to make sure WBC is not elevated. Follow up if worsening or not improving.

## 2012-11-16 LAB — CBC WITH DIFFERENTIAL/PLATELET
Basophils Absolute: 0 10*3/uL (ref 0.0–0.1)
Basophils Relative: 0 % (ref 0–1)
Eosinophils Relative: 2 % (ref 0–5)
HCT: 45.2 % (ref 36.0–46.0)
MCHC: 34.5 g/dL (ref 30.0–36.0)
Monocytes Absolute: 0.8 10*3/uL (ref 0.1–1.0)
Neutro Abs: 4.2 10*3/uL (ref 1.7–7.7)
RDW: 14.2 % (ref 11.5–15.5)

## 2012-11-19 ENCOUNTER — Other Ambulatory Visit: Payer: Self-pay | Admitting: Physician Assistant

## 2012-11-19 DIAGNOSIS — R5381 Other malaise: Secondary | ICD-10-CM

## 2012-11-19 DIAGNOSIS — D582 Other hemoglobinopathies: Secondary | ICD-10-CM

## 2012-11-24 ENCOUNTER — Telehealth: Payer: Self-pay | Admitting: Oncology

## 2012-11-24 NOTE — Telephone Encounter (Signed)
C/D 11/24/12 for appt. 12/03/12 °

## 2012-11-24 NOTE — Telephone Encounter (Signed)
S/W PT IN REF TO NP APPT. ON 12/03/12@10 :30 REFERRING JADE BREEBACK,PA-C DX HEMOCHROMOTOSIS MAILED NP PACKET

## 2012-12-03 ENCOUNTER — Ambulatory Visit (HOSPITAL_BASED_OUTPATIENT_CLINIC_OR_DEPARTMENT_OTHER): Payer: BC Managed Care – PPO

## 2012-12-03 ENCOUNTER — Other Ambulatory Visit (HOSPITAL_BASED_OUTPATIENT_CLINIC_OR_DEPARTMENT_OTHER): Payer: BC Managed Care – PPO | Admitting: Lab

## 2012-12-03 ENCOUNTER — Encounter: Payer: Self-pay | Admitting: Oncology

## 2012-12-03 ENCOUNTER — Ambulatory Visit (HOSPITAL_BASED_OUTPATIENT_CLINIC_OR_DEPARTMENT_OTHER): Payer: BC Managed Care – PPO | Admitting: Oncology

## 2012-12-03 VITALS — BP 122/82 | HR 89 | Temp 97.1°F | Resp 20 | Ht 66.0 in | Wt 175.8 lb

## 2012-12-03 LAB — COMPREHENSIVE METABOLIC PANEL (CC13)
ALT: 25 U/L (ref 0–55)
AST: 24 U/L (ref 5–34)
Albumin: 3.9 g/dL (ref 3.5–5.0)
Calcium: 9.6 mg/dL (ref 8.4–10.4)
Chloride: 108 mEq/L — ABNORMAL HIGH (ref 98–107)
Creatinine: 1 mg/dL (ref 0.6–1.1)
Potassium: 4.1 mEq/L (ref 3.5–5.1)
Sodium: 141 mEq/L (ref 136–145)
Total Protein: 7.6 g/dL (ref 6.4–8.3)

## 2012-12-03 LAB — CBC WITH DIFFERENTIAL/PLATELET
Eosinophils Absolute: 0.1 10*3/uL (ref 0.0–0.5)
HGB: 15.6 g/dL (ref 11.6–15.9)
MONO#: 0.5 10*3/uL (ref 0.1–0.9)
NEUT#: 3.3 10*3/uL (ref 1.5–6.5)
RBC: 5.25 10*6/uL (ref 3.70–5.45)
RDW: 14.2 % (ref 11.2–14.5)
WBC: 6.6 10*3/uL (ref 3.9–10.3)
lymph#: 2.6 10*3/uL (ref 0.9–3.3)

## 2012-12-03 LAB — CHCC SMEAR

## 2012-12-03 LAB — IRON AND TIBC
%SAT: 19 % — ABNORMAL LOW (ref 20–55)
TIBC: 413 ug/dL (ref 250–470)

## 2012-12-03 LAB — TSH: TSH: 1.305 u[IU]/mL (ref 0.350–4.500)

## 2012-12-03 NOTE — Patient Instructions (Addendum)
1.  Issue:  Polycythemia (elevated red blood cell). 2.  Potential causes:  Conditions that result in hypoxia (smoking, obstructive sleep apnea, COPD); on diuretic blood pressure; rarely polycythemia vera (PV:  A condition where the bone marrow autonomously produces red blood cell without feed back mechanism).  3.  Work up:  I sent for JAK-2 mutation (a blood test that is very sensitive for PV). 4.  If PV:  Treatment include phlebotomy.   If not PV, may consider donating blood at [facility] on a routine basis.  5.  Follow up:  Depend on work up.

## 2012-12-03 NOTE — Progress Notes (Signed)
[facility]  [phone] [phone]     INITIAL HEMATOLOGY CONSULTATION    Referral MD:  Dr. Barbarann Ehlers. Linford Arnold, M.D.   Reason for Referral: polycythemia.     HPI: Cassandra Ford is a 52 year-old woman with history of chronic migraines.  She was evaluated by different neurologists who disagreed as to whether she has MS or not.  She has had regular CBC with her PCP. The oldest available CBC dated back to 06/01/2007 where her Hgb was 15.2 g/dL.  It elevated to as high as 16.6 g/dL on 1/91/4782.  The most recent CBC from 11/15/2012 showed Hgb at 15.6.  Given persistent polycythemia, she was referred to the Cancer Center for evaluation.  Cassandra Ford presented to the clinic for the first time today with her sister.  She denied smoking.  She wakes up feeling tired; however, she lives by herself and does not know whether she snores or has apnea periods or not. He denied taking diuretics.  She has chronic migrain headache and fatigue.  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  She has diffuse body ache especially in her shoulders and hands which she attributed to carpal tunnel.    Patient denies fever, anorexia, weight loss, visual changes, confusion, drenching night sweats, palpable lymph node swelling, mucositis, odynophagia, dysphagia, nausea vomiting, jaundice, chest pain, palpitation, shortness of breath, dyspnea on exertion, productive cough, gum bleeding, epistaxis, hematemesis, hemoptysis, abdominal pain, abdominal swelling, early satiety, melena, hematochezia, hematuria, skin rash, spontaneous bleeding,  heat or cold intolerance, bowel bladder incontinence, back pain, focal motor weakness, paresthesia.      Past Medical History  Diagnosis Date  . Allergy     to dust mites  . Chronic constipation   . Migraines   :    Past Surgical History  Procedure Laterality Date  . C5-c6 fusion    . Mri cspine- cervical  spondylosis, narrow foramen    . Mri l spine- l4-5 bulge, sm central tear    :   CURRENT MEDS: Current Outpatient Prescriptions  Medication Sig Dispense Refill  . ALPRAZolam (XANAX) 1 MG tablet TAKE 1 TABLET AT BEDTIME AS NEEDED FOR ANXIETY  30 tablet  3  . cyclobenzaprine (FLEXERIL) 5 MG tablet TAKE 1 TO 2 TABLETS AT BEDTIME AS NEEDED SPASMS  60 tablet  2  . multivitamin (THERAGRAN) per tablet Take 1 tablet by mouth daily.        . rizatriptan (MAXALT) 10 MG tablet Take 1 tablet (10 mg total) by mouth as needed. May repeat in 2 hours if needed  8 tablet  3  . topiramate (TOPAMAX) 25 MG capsule Take 25 mg by mouth at bedtime.        No current facility-administered medications for this visit.      Allergies  Allergen Reactions  . Sertraline Hcl   :  Family History  Problem Relation Age of Onset  . Diabetes Mother   . Melanoma Sister   :  History   Social History  . Marital Status: Single    Spouse Name: N/A    Number of Children: 0  . Years of Education: N/A   Occupational History  . [employer]OM CLERK    Social History Main Topics  . Smoking status: Former Smoker -- 0.25 packs/day    Quit date: 10/06/2002  . Smokeless tobacco: Never Used  . Alcohol Use: No  . Drug Use: Not on file  .  Sexually Active: Not on file   Other Topics Concern  . Not on file   Social History Narrative  . No narrative on file  :  REVIEW OF SYSTEM:  The rest of the 14-point review of sytem was negative.   Exam: ECOG 1 due to chronic fatigue and migraine.   General:  well-nourished woman, in no acute distress.  Eyes:  no scleral icterus.  ENT:  There were no oropharyngeal lesions.  Oropharynx was not narrowed. Neck was without thyromegaly.  Lymphatics:  Negative cervical, supraclavicular or axillary adenopathy.  Respiratory: lungs were clear bilaterally without wheezing or crackles.  Cardiovascular:  Regular rate and rhythm, S1/S2, without murmur, rub or gallop.  There was no pedal  edema.  GI:  abdomen was soft, flat, nontender, nondistended, without organomegaly.  Muscoloskeletal:  no spinal tenderness of palpation of vertebral spine.  There were clubbing of her fingers and toes but not cyanosis.  Skin exam was without echymosis, petichae.  Neuro exam was nonfocal.  Patient was able to get on and off exam table without assistance.  Gait was normal.  Patient was alert and oriented.  Attention was good.   Language was appropriate.  Mood was depressed appearing and flat affect.   Speech was not pressured.  Thought content was not tangential.    LABS:  Lab Results  Component Value Date   WBC 6.6 12/03/2012   HGB 15.6 12/03/2012   HCT 45.3 12/03/2012   PLT 319 12/03/2012   GLUCOSE 89 12/03/2012   CHOL 199 02/09/2012   TRIG 126 02/09/2012   HDL 58 02/09/2012   LDLCALC 960* 02/09/2012   ALT 25 12/03/2012   AST 24 12/03/2012   NA 141 12/03/2012   K 4.1 12/03/2012   CL 108* 12/03/2012   CREATININE 1.0 12/03/2012   BUN 19.3 12/03/2012   CO2 23 12/03/2012    No results found.  Blood smear review:   I personally reviewed the patient's peripheral blood smear today.  There was isocytosis.  There was no peripheral blast.  There was no schistocytosis, spherocytosis, target cell, rouleaux formation, tear drop cell.  There was no giant platelets or platelet clumps.      ASSESSMENT AND PLAN:   1.  Issue:  Polycythemia (elevated red blood cell). 2.  Potential causes:  Conditions that result in hypoxia (smoking, obstructive sleep apnea, COPD); on diuretic blood pressure; rarely polycythemia vera (PV:  A condition where the bone marrow autonomously produces red blood cell without feed back mechanism).  In her case, even though she had clubbing of her fingers and toes; there were no apparent causes of hypoxia.  3.  Work up:  I sent for JAK-2 mutation (a blood test that is very sensitive for PV). If this is negative, it has very good negative predictive value, effectively ruling out PV.   4.  If  testing turns out to be PV:  Treatment includes phlebotomy.  She does not have DVT/PE; therefore, Hydrea is not indicated until she turns 66.   5.  Follow up:  Depend on work up.     The length of time of the face-to-face encounter was 30 minutes. More than 50% of time was spent counseling and coordination of care.     Thank you for this referral.

## 2012-12-09 ENCOUNTER — Ambulatory Visit (INDEPENDENT_AMBULATORY_CARE_PROVIDER_SITE_OTHER): Payer: BC Managed Care – PPO | Admitting: Family Medicine

## 2012-12-09 ENCOUNTER — Encounter: Payer: Self-pay | Admitting: Family Medicine

## 2012-12-09 VITALS — BP 133/74 | HR 103 | Ht 64.0 in | Wt 176.0 lb

## 2012-12-09 DIAGNOSIS — D45 Polycythemia vera: Secondary | ICD-10-CM

## 2012-12-09 MED ORDER — ALPRAZOLAM 1 MG PO TABS: ORAL_TABLET | ORAL | Status: DC

## 2012-12-09 NOTE — Progress Notes (Signed)
  Subjective:    Patient ID: Cassandra Ford, female    DOB: 10-17-1960, 52 y.o.   MRN: [id number]  HPI Anxiety - He ranxiety was high lately bc got a letter from the Cancer center and "freaked out".  Didn't realize it was the hemetology referral for her abnormal blood count. Overall she is doing well. She really found someone in her life and says she has been in "love". This made her really happy lately.    Review of Systems     Objective:   Physical Exam  Constitutional: She is oriented to person, place, and time. She appears well-developed and well-nourished.  HENT:  Head: Normocephalic and atraumatic.  Cardiovascular: Normal rate, regular rhythm and normal heart sounds.   Pulmonary/Chest: Effort normal and breath sounds normal.  Neurological: She is alert and oriented to person, place, and time.  Skin: Skin is warm and dry.  Psychiatric: She has a normal mood and affect. Her behavior is normal.          Assessment & Plan:  Anxiety - GAD- 7 score of 0.  Overall she's doing really well. We'll continue when necessary Xanax. She's not on any controller such as an SSRI but overall think she's doing well. Followup in 6 months. Warned about judicious use of the medication.  polycythema vera - Reviewd information in Dr. Lodema Pilot notes with her.  Still awaiting Jak-2 results. Can consider further f/u for this if JAK- 2. She has not real sxs of COPD.  She is not a current smoker. No sxs of COPD.  Brother has PV.    Time spent 15 min.>50% spent in counseling about her anxiety

## 2012-12-22 ENCOUNTER — Telehealth: Payer: Self-pay | Admitting: *Deleted

## 2012-12-22 ENCOUNTER — Encounter: Payer: Self-pay | Admitting: Oncology

## 2012-12-22 NOTE — Telephone Encounter (Signed)
Pt left VM requests results of labs that were done here on 12/03/12.

## 2013-06-02 ENCOUNTER — Encounter: Payer: Self-pay | Admitting: Family Medicine

## 2013-06-02 ENCOUNTER — Ambulatory Visit (INDEPENDENT_AMBULATORY_CARE_PROVIDER_SITE_OTHER): Payer: BC Managed Care – PPO | Admitting: Family Medicine

## 2013-06-02 ENCOUNTER — Other Ambulatory Visit (HOSPITAL_COMMUNITY)
Admission: RE | Admit: 2013-06-02 | Discharge: 2013-06-02 | Disposition: A | Discharge status: Home / Self Care | Payer: BC Managed Care – PPO | Source: Ambulatory Visit | Attending: Family Medicine | Admitting: Family Medicine

## 2013-06-02 VITALS — BP 143/84 | HR 102 | Ht 66.0 in | Wt 168.0 lb

## 2013-06-02 DIAGNOSIS — N949 Unspecified condition associated with female genital organs and menstrual cycle: Secondary | ICD-10-CM

## 2013-06-02 DIAGNOSIS — M549 Dorsalgia, unspecified: Secondary | ICD-10-CM

## 2013-06-02 DIAGNOSIS — N950 Postmenopausal bleeding: Secondary | ICD-10-CM

## 2013-06-02 DIAGNOSIS — R102 Pelvic and perineal pain: Secondary | ICD-10-CM

## 2013-06-02 DIAGNOSIS — Z113 Encounter for screening for infections with a predominantly sexual mode of transmission: Secondary | ICD-10-CM | POA: Insufficient documentation

## 2013-06-02 DIAGNOSIS — Z01419 Encounter for gynecological examination (general) (routine) without abnormal findings: Secondary | ICD-10-CM | POA: Insufficient documentation

## 2013-06-02 DIAGNOSIS — Z1151 Encounter for screening for human papillomavirus (HPV): Secondary | ICD-10-CM | POA: Insufficient documentation

## 2013-06-02 LAB — POCT URINALYSIS DIPSTICK
Bilirubin, UA: NEGATIVE
Glucose, UA: NEGATIVE
Leukocytes, UA: NEGATIVE
Nitrite, UA: NEGATIVE
Urobilinogen, UA: 0.2

## 2013-06-02 MED ORDER — TOPIRAMATE 100 MG PO TABS: 100.0000 mg | ORAL_TABLET | Freq: Every day | ORAL | Status: DC

## 2013-06-02 MED ORDER — ALPRAZOLAM 1 MG PO TABS: ORAL_TABLET | ORAL | Status: DC

## 2013-06-02 MED ORDER — CYCLOBENZAPRINE HCL 5 MG PO TABS: ORAL_TABLET | ORAL | Status: DC

## 2013-06-02 MED ORDER — RIZATRIPTAN BENZOATE 10 MG PO TABS: 10.0000 mg | ORAL_TABLET | ORAL | Status: DC | PRN

## 2013-06-02 NOTE — Progress Notes (Signed)
  Subjective:    Patient ID: Cassandra Ford, female    DOB: 1960/12/05, 52 y.o.   MRN: [id number]  HPI F/U mood - doing well overall. She says she's doing really well. She's been able to handle a lot of stress at work and has been calm even when her boss has been calm. She sleeping well. The relationship that she had mentioned previously has ended but she still friends with him.  Started having vaginal bleeding about a week ago. Her last period was 3 years ago.  Then this AM felt some  Back pain and then started to spot again. No clots.  Had a new sexual partner back in the spring.  Not taking any hormones.  She has lost some weight.  She is not taking any topical hormones. No pelvic pain. No urinary sxs. No worsening or alleviating symptoms.     Review of Systems     Objective:   Physical Exam  Genitourinary: Vagina normal. There is no rash or tenderness on the right labia. There is no rash or tenderness on the left labia. Uterus is not deviated, not enlarged, not fixed and not tender. Cervix exhibits motion tenderness. Cervix exhibits no discharge and no friability. Right adnexum displays no mass, no tenderness and no fullness. Left adnexum displays no mass, no tenderness and no fullness. No tenderness or bleeding around the vagina. No foreign body around the vagina. No vaginal discharge found.          Assessment & Plan:  GAD - GAD- 7 score of 4.  She is doing really well. well controlled. Ok to refill her xanax. Again use sparingly.  Post menopausal bleeding - will order US pelvis and will refer for endometrial bx.  Will check CBC, tsh, and hormone levels.  Pap performed today. Will call with results. Cervix was tender on exam.

## 2013-06-08 ENCOUNTER — Telehealth: Payer: Self-pay | Admitting: *Deleted

## 2013-06-08 DIAGNOSIS — N950 Postmenopausal bleeding: Secondary | ICD-10-CM

## 2013-06-08 NOTE — Telephone Encounter (Signed)
Left detailed pt informing her to have dr.sigal to send a report once the imaging has been done and that dr.metheney has placed her orders for the endometrial bx and pelvic US.Cassandra Ford

## 2013-06-08 NOTE — Telephone Encounter (Signed)
Orders placed.  We may need to check on Cassandra Ford pap if we don't get results buy Friday. Ask Cassandra Ford to have Dr. Maryln Gottron send me copy of imaging results when done

## 2013-06-08 NOTE — Telephone Encounter (Signed)
Pt stated that she got her report from Dr. Rutherford Nail [phone] she had a CRX done on 7.31.14 and was told that before she left they didn't see anything. Then she received a letter stating that she has a lung nodule and she will need to come in to have a CT on Friday at 11 am to determine how life threatening this is. Pt stated that you asked her to call your nurse with any updates to her health history, Also pt was asking about the endometrial bx and the pelvic US.Loralee Pacas [place]

## 2013-06-10 LAB — FOLLICLE STIMULATING HORMONE: FSH: 108.8 m[IU]/mL

## 2013-06-10 LAB — CBC WITH DIFFERENTIAL/PLATELET
Basophils Absolute: 0 10*3/uL (ref 0.0–0.1)
Basophils Relative: 0 % (ref 0–1)
HCT: 43.5 % (ref 36.0–46.0)
MCHC: 34.5 g/dL (ref 30.0–36.0)
Monocytes Absolute: 0.3 10*3/uL (ref 0.1–1.0)
Neutro Abs: 2.5 10*3/uL (ref 1.7–7.7)
RDW: 15.3 % (ref 11.5–15.5)

## 2013-06-10 LAB — ESTRADIOL: Estradiol: <11.8 pg/mL

## 2013-06-10 LAB — HIV ANTIBODY (ROUTINE TESTING W REFLEX): HIV: NONREACTIVE

## 2013-06-10 LAB — LUTEINIZING HORMONE: LH: 77.1 m[IU]/mL — ABNORMAL HIGH

## 2013-06-24 ENCOUNTER — Other Ambulatory Visit: Payer: BC Managed Care – PPO

## 2013-06-24 ENCOUNTER — Ambulatory Visit (INDEPENDENT_AMBULATORY_CARE_PROVIDER_SITE_OTHER): Payer: BC Managed Care – PPO | Admitting: Physician Assistant

## 2013-06-24 ENCOUNTER — Ambulatory Visit (INDEPENDENT_AMBULATORY_CARE_PROVIDER_SITE_OTHER): Payer: BC Managed Care – PPO

## 2013-06-24 ENCOUNTER — Encounter: Payer: Self-pay | Admitting: Physician Assistant

## 2013-06-24 VITALS — BP 118/76 | HR 79 | Wt 170.0 lb

## 2013-06-24 DIAGNOSIS — N854 Malposition of uterus: Secondary | ICD-10-CM

## 2013-06-24 DIAGNOSIS — N950 Postmenopausal bleeding: Secondary | ICD-10-CM

## 2013-06-24 DIAGNOSIS — G43909 Migraine, unspecified, not intractable, without status migrainosus: Secondary | ICD-10-CM

## 2013-06-24 DIAGNOSIS — R9389 Abnormal findings on diagnostic imaging of other specified body structures: Secondary | ICD-10-CM

## 2013-06-24 DIAGNOSIS — D251 Intramural leiomyoma of uterus: Secondary | ICD-10-CM

## 2013-06-24 MED ORDER — KETOROLAC TROMETHAMINE 60 MG/2ML IM SOLN
60.0000 mg | Freq: Once | INTRAMUSCULAR | Status: AC
  Administered 2013-06-24: 60 mg via INTRAMUSCULAR

## 2013-06-24 MED ORDER — RIZATRIPTAN BENZOATE 10 MG PO TABS: 10.0000 mg | ORAL_TABLET | ORAL | Status: DC | PRN

## 2013-06-24 NOTE — Progress Notes (Signed)
  Subjective:    Patient ID: Cassandra Ford, female    DOB: 1961/03/20, 52 y.o.   MRN: [id number]  HPI Patient is a 52 yo female who presents to the clinic with ongoing migraine. Pt has long hx of migraines. Maxalt helps significantly. Insurance only pays for 8 tabs a month and she ran out this month. She takes topamax daily for prevention. She had been seeing a neurologist Dr. Tyrell Antonio but then [facility] took over care because migraines so controlled. Pt has on average 8-12 migraines a month but maxalt stops then. Current migraine started at 10:00 this morning. It affects her vision and eyes. Located on left side of head and feels like an brain freeze. Pt aware that weather and stress is a trigger. She has had more migraines since evaluation of cancer has been started.     Review of Systems     Objective:   Physical Exam  Constitutional:  Not able to open eyes to talk.   HENT:  Head: Normocephalic and atraumatic.  Eyes: EOM are normal. Pupils are equal, round, and reactive to light.  Cardiovascular: Normal rate, regular rhythm and normal heart sounds.   Pulmonary/Chest: Effort normal and breath sounds normal. She has no wheezes.  Neurological:  Speech not clear. Stuttered through some words. No facial asymmetry.    Skin: Skin is warm and dry.  Psychiatric: She has a normal mood and affect. Her behavior is normal.          Assessment & Plan:  Migraine- Gave shot of Toradol 60mg  in office today. Wrote prescription for 12 tabs monthly for maxalt. May need to get approved and willing to write letter to insurance company. Pt declined anti-nausea medication. Wrote out of work today. If continues to have migraine so frequent may need to go back to neurologist.

## 2013-06-28 ENCOUNTER — Telehealth: Payer: Self-pay | Admitting: *Deleted

## 2013-06-28 NOTE — Telephone Encounter (Signed)
Pt's insurance has denied Rizatriptan 10mg . Pharmacy informed.  Meyer Cory, LPN

## 2013-07-21 ENCOUNTER — Encounter: Payer: Self-pay | Admitting: Family Medicine

## 2013-07-21 ENCOUNTER — Ambulatory Visit (INDEPENDENT_AMBULATORY_CARE_PROVIDER_SITE_OTHER): Payer: BC Managed Care – PPO | Admitting: Family Medicine

## 2013-07-21 VITALS — BP 120/80 | HR 81 | Ht 65.0 in | Wt 171.0 lb

## 2013-07-21 DIAGNOSIS — N950 Postmenopausal bleeding: Secondary | ICD-10-CM

## 2013-07-21 NOTE — Patient Instructions (Signed)
Postmenopausal Bleeding Menopause is commonly referred to as the "change in life." It is a time when the fertile years, the time of ovulating and having menstrual periods, has come to an end. It is also determined by not having menstrual periods for 12 months.  Postmenopausal bleeding is any bleeding a woman has after she has entered into menopause. Any type of postmenopausal bleeding, even if it appears to be a typical menstrual period, is concerning. This should be evaluated by your caregiver.  CAUSES   Hormone therapy.  Cancer of the cervix or cancer of the lining of the uterus (endometrial cancer).  Thinning of the uterine lining (uterine atrophy).  Thyroid diseases.  Certain medicines.  Infection of the uterus or cervix.  Inflammation or irritation of the uterine lining (endometritis).  Estrogen-secreting tumors.  Growths (polyps) on the cervix, uterine lining, or uterus.  Uterine tumors (fibroids).  Being very overweight (obese). DIAGNOSIS  Your caregiver will take a medical history and ask questions. A physical exam will also be performed. Further tests may include:   A transvaginal ultrasound. An ultrasound wand or probe is inserted into your vagina to view the pelvic organs.  A biopsy of the lining of the uterus (endometrium). A sample of the endometrium is removed and examined.  A hysteroscopy. Your caregiver may use an instrument with a light and a camera attached to it (hysteroscope). The hysteroscope is used to look inside the uterus for problems.  A dilation and curettage (D&C). Tissue is removed from the uterine lining to be examined for problems. TREATMENT  Treatment depends on the cause of the bleeding. Some treatments include:   Surgery.  Medicines.  Hormones.  A hysteroscopy or D&C to remove polyps or fibroids.  Changing or stopping a current medicine you are taking. Talk to your caregiver about your specific treatment. HOME CARE INSTRUCTIONS    Maintain a healthy weight.  Keep regular pelvic exams and Pap tests. SEEK MEDICAL CARE IF:   You have bleeding, even if it is light in comparison to your previous periods.  Your bleeding lasts more than 1 week.  You have abdominal pain.  You develop bleeding with sexual intercourse. SEEK IMMEDIATE MEDICAL CARE IF:   You have a fever, chills, headache, dizziness, muscle aches, and bleeding.  You have severe pain with bleeding.  You are passing blood clots.  You have bleeding and need more than 1 pad an hour.  You feel faint. MAKE SURE YOU:  Understand these instructions.  Will watch your condition.  Will get help right away if you are not doing well or get worse. Document Released: 12/31/2005 Document Revised: 12/15/2011 Document Reviewed: 05/29/2011 Exit[employer] Patient Information 2014 [employer].  

## 2013-07-21 NOTE — Progress Notes (Signed)
Patient has stopped having her period for three years and now has started bleeding heavy.  Even when she had her cycles they were very light and maybe lasted three days.  She is also having a lot of pain with this bleeding and severe headaches.

## 2013-07-22 DIAGNOSIS — N950 Postmenopausal bleeding: Secondary | ICD-10-CM | POA: Insufficient documentation

## 2013-07-22 MED ORDER — MISOPROSTOL 200 MCG PO TABS: 400.0000 ug | ORAL_TABLET | Freq: Two times a day (BID) | ORAL | Status: DC

## 2013-07-22 NOTE — Progress Notes (Signed)
Subjective:    Patient ID: Cassandra Ford, female    DOB: December 28, 1960, 52 y.o.   MRN: [id number]  HPI  New pt. Referred for post-menopausal bleeding. No period in 3 years.  Additionally, has seen oncology and pulmonary, as she has been worked up for multiple problems including polycythemia all testing which has come back normal.  She reports increased bleeding on several occasions.  W/u includes Nml pap, nml TSH and pelvic imaging which shows enlarged uterus at 11.8 x 7.5 x 10.2 cm with some fibroids.  Endometrium is thickened at 19.4 mm.  Past Medical History  Diagnosis Date  . Allergy     to dust mites  . Chronic constipation   . Migraines    Past Surgical History  Procedure Laterality Date  . C5-c6 fusion    . Mri cspine- cervical spondylosis, narrow foramen    . Mri l spine- l4-5 bulge, sm central tear     Current Outpatient Prescriptions on File Prior to Visit  Medication Sig Dispense Refill  . ALPRAZolam (XANAX) 1 MG tablet TAKE 1 TABLET AT BEDTIME AS NEEDED FOR ANXIETY  30 tablet  5  . cyclobenzaprine (FLEXERIL) 5 MG tablet TAKE 1 TO 2 TABLETS AT BEDTIME AS NEEDED SPASMS  60 tablet  2  . multivitamin (THERAGRAN) per tablet Take 1 tablet by mouth daily.        . rizatriptan (MAXALT) 10 MG tablet Take 1 tablet (10 mg total) by mouth as needed. May repeat in 2 hours if needed  12 tablet  3  . topiramate (TOPAMAX) 100 MG tablet Take 1 tablet (100 mg total) by mouth daily.  30 tablet  3   No current facility-administered medications on file prior to visit.   Allergies  Allergen Reactions  . Sertraline Hcl    History   Social History  . Marital Status: Single    Spouse Name: N/A    Number of Children: 0  . Years of Education: N/A   Occupational History  . COURTROOM CLERK    Social History Main Topics  . Smoking status: Former Smoker -- 0.25 packs/day    Quit date: 10/06/2002  . Smokeless tobacco: Never Used  . Alcohol Use: No  . Drug Use: Not on file  . Sexual  Activity: Not on file   Other Topics Concern  . Not on file   Social History Narrative  . No narrative on file   Family History  Problem Relation Age of Onset  . Diabetes Mother   . Melanoma Sister      Review of Systems  Constitutional: Positive for fatigue. Negative for fever and chills.  Eyes: Negative for pain and visual disturbance.  Respiratory: Negative for shortness of breath and wheezing.   Cardiovascular: Negative for chest pain.  Gastrointestinal: Negative for vomiting, diarrhea and constipation.  Genitourinary: Positive for menstrual problem. Negative for dysuria and vaginal discharge.  Skin: Negative for rash.  Neurological: Negative for headaches.  Psychiatric/Behavioral:       Anxiety       Objective:   Physical Exam  Vitals reviewed. Constitutional: She is oriented to person, place, and time. She appears well-developed and well-nourished.  Eyes: No scleral icterus.  Neck: Neck supple.  Cardiovascular: Normal rate.   Pulmonary/Chest: Effort normal.  Abdominal: Soft. There is no tenderness.  Genitourinary: Vagina normal.  Cervix is stenotic.  Musculoskeletal: Normal range of motion. She exhibits no tenderness.  Neurological: She is alert and oriented to person, place, and  time.  Skin: Skin is warm and dry.  Psychiatric: She has a normal mood and affect.   Procedure: Patient given informed consent, signed copy in the chart, time out was performed. Appropriate time out taken. . The patient was placed in the lithotomy position and the cervix brought into view with sterile speculum.  Portio of cervix cleansed x 2 with betadine swabs.  A tenaculum was placed in the anterior lip of the cervix. Attempt was made to sound the uterus but the pippelle could not be introduced. An os finder was used and still no os was found.           Assessment & Plan:

## 2013-07-22 NOTE — Assessment & Plan Note (Signed)
I have attempted in-office sampling and this could not be completed.  Given thickness of the endometrium will go for D & C and hysteroscopy and give cytotec prior to procedure.  Risks include but are not limited to bleeding, infection, injury to surrounding structures, including bowel, bladder and ureters, blood clots, and death.  Likelihood of success is high.

## 2013-07-25 ENCOUNTER — Telehealth: Payer: Self-pay | Admitting: *Deleted

## 2013-07-25 NOTE — Telephone Encounter (Signed)
Called pt to adv meds are at pharm ready for her to pick up - [facility]

## 2013-07-28 ENCOUNTER — Encounter (HOSPITAL_COMMUNITY): Payer: Self-pay

## 2013-08-01 ENCOUNTER — Encounter (HOSPITAL_COMMUNITY)
Admission: RE | Admit: 2013-08-01 | Discharge: 2013-08-01 | Disposition: A | Discharge status: Home / Self Care | Payer: BC Managed Care – PPO | Source: Ambulatory Visit | Attending: Family Medicine | Admitting: Family Medicine

## 2013-08-01 ENCOUNTER — Encounter (HOSPITAL_COMMUNITY): Payer: Self-pay

## 2013-08-01 DIAGNOSIS — Z01818 Encounter for other preprocedural examination: Secondary | ICD-10-CM | POA: Insufficient documentation

## 2013-08-01 DIAGNOSIS — Z01812 Encounter for preprocedural laboratory examination: Secondary | ICD-10-CM | POA: Insufficient documentation

## 2013-08-01 HISTORY — DX: Anxiety disorder, unspecified: F41.9

## 2013-08-01 HISTORY — DX: Reserved for concepts with insufficient information to code with codable children: IMO0002

## 2013-08-01 LAB — CBC
HCT: 43.8 % (ref 36.0–46.0)
Platelets: 271 10*3/uL (ref 150–400)
RDW: 13.4 % (ref 11.5–15.5)
WBC: 7.3 10*3/uL (ref 4.0–10.5)

## 2013-08-01 NOTE — Patient Instructions (Addendum)
   Your procedure is scheduled on: Monday, Nov 3  Enter through the [employer] [facility] at: 730 AM Pick up the phone at the desk and dial [phone] and inform us of your arrival.  Please call this number if you have any problems the morning of surgery: [phone]  Remember: Do not eat or drink after midnight:Sunday Take these medicines the morning of surgery with a SIP OF WATER:  None  Do not wear jewelry, make-up, or FINGER nail polish No metal in your hair or on your body. Do not wear lotions, powders, perfumes. You may wear deodorant.  Please use your CHG wash as directed prior to surgery.  Do not shave anywhere for at least 12 hours prior to first CHG shower.  Do not bring valuables to the hospital. Contacts, dentures or bridgework may not be worn into surgery.  Patients discharged on the day of surgery will not be allowed to drive home.  Home with sister Catalina Pizza  cell [phone].

## 2013-08-08 ENCOUNTER — Ambulatory Visit (HOSPITAL_COMMUNITY): Payer: BC Managed Care – PPO | Admitting: Anesthesiology

## 2013-08-08 ENCOUNTER — Encounter (HOSPITAL_COMMUNITY): Payer: BC Managed Care – PPO | Admitting: Anesthesiology

## 2013-08-08 ENCOUNTER — Encounter (HOSPITAL_COMMUNITY)
Admission: RE | Disposition: A | Discharge status: Home / Self Care | Payer: Self-pay | Source: Ambulatory Visit | Attending: Family Medicine

## 2013-08-08 ENCOUNTER — Ambulatory Visit (HOSPITAL_COMMUNITY)
Admission: RE | Admit: 2013-08-08 | Discharge: 2013-08-08 | Disposition: A | Discharge status: Home / Self Care | Payer: BC Managed Care – PPO | Source: Ambulatory Visit | Attending: Family Medicine | Admitting: Family Medicine

## 2013-08-08 ENCOUNTER — Encounter (HOSPITAL_COMMUNITY): Payer: Self-pay | Admitting: *Deleted

## 2013-08-08 DIAGNOSIS — N840 Polyp of corpus uteri: Secondary | ICD-10-CM | POA: Insufficient documentation

## 2013-08-08 DIAGNOSIS — R9389 Abnormal findings on diagnostic imaging of other specified body structures: Secondary | ICD-10-CM | POA: Insufficient documentation

## 2013-08-08 DIAGNOSIS — D259 Leiomyoma of uterus, unspecified: Secondary | ICD-10-CM | POA: Insufficient documentation

## 2013-08-08 DIAGNOSIS — N950 Postmenopausal bleeding: Secondary | ICD-10-CM

## 2013-08-08 HISTORY — PX: HYSTEROSCOPY W/D&C: SHX1775

## 2013-08-08 LAB — PREGNANCY, URINE: Preg Test, Ur: NEGATIVE

## 2013-08-08 SURGERY — DILATATION AND CURETTAGE /HYSTEROSCOPY
Anesthesia: General | Site: Vagina | Wound class: Clean Contaminated

## 2013-08-08 MED ORDER — KETOROLAC TROMETHAMINE 30 MG/ML IJ SOLN
INTRAMUSCULAR | Status: AC
  Filled 2013-08-08: qty 1

## 2013-08-08 MED ORDER — LACTATED RINGERS IV SOLN
INTRAVENOUS | Status: DC
  Administered 2013-08-08: 08:00:00 via INTRAVENOUS

## 2013-08-08 MED ORDER — LIDOCAINE HCL (CARDIAC) 20 MG/ML IV SOLN
INTRAVENOUS | Status: DC | PRN
  Administered 2013-08-08: 75 mg via INTRAVENOUS

## 2013-08-08 MED ORDER — PROPOFOL 10 MG/ML IV BOLUS
INTRAVENOUS | Status: DC | PRN
  Administered 2013-08-08: 200 mg via INTRAVENOUS

## 2013-08-08 MED ORDER — KETOROLAC TROMETHAMINE 30 MG/ML IJ SOLN
INTRAMUSCULAR | Status: DC | PRN
  Administered 2013-08-08: 30 mg via INTRAVENOUS

## 2013-08-08 MED ORDER — ONDANSETRON HCL 4 MG/2ML IJ SOLN
INTRAMUSCULAR | Status: DC | PRN
  Administered 2013-08-08: 4 mg via INTRAVENOUS

## 2013-08-08 MED ORDER — FENTANYL CITRATE 0.05 MG/ML IJ SOLN
INTRAMUSCULAR | Status: AC
  Administered 2013-08-08: 50 ug via INTRAVENOUS
  Filled 2013-08-08: qty 2

## 2013-08-08 MED ORDER — FENTANYL CITRATE 0.05 MG/ML IJ SOLN
INTRAMUSCULAR | Status: AC
  Filled 2013-08-08: qty 5

## 2013-08-08 MED ORDER — GLYCINE 1.5 % IR SOLN
Status: DC | PRN
  Administered 2013-08-08: 3000 mL

## 2013-08-08 MED ORDER — MIDAZOLAM HCL 2 MG/2ML IJ SOLN
INTRAMUSCULAR | Status: DC | PRN
  Administered 2013-08-08: 2 mg via INTRAVENOUS

## 2013-08-08 MED ORDER — BUPIVACAINE-EPINEPHRINE PF 0.25-1:200000 % IJ SOLN
INTRAMUSCULAR | Status: AC
  Filled 2013-08-08: qty 30

## 2013-08-08 MED ORDER — LACTATED RINGERS IV SOLN
INTRAVENOUS | Status: DC
  Administered 2013-08-08 (×2): via INTRAVENOUS

## 2013-08-08 MED ORDER — LIDOCAINE HCL (CARDIAC) 20 MG/ML IV SOLN
INTRAVENOUS | Status: AC
  Filled 2013-08-08: qty 5

## 2013-08-08 MED ORDER — IBUPROFEN 600 MG PO TABS: 600.0000 mg | ORAL_TABLET | Freq: Four times a day (QID) | ORAL | Status: AC | PRN

## 2013-08-08 MED ORDER — FENTANYL CITRATE 0.05 MG/ML IJ SOLN
25.0000 ug | INTRAMUSCULAR | Status: DC | PRN
  Administered 2013-08-08 (×2): 50 ug via INTRAVENOUS

## 2013-08-08 MED ORDER — MIDAZOLAM HCL 2 MG/2ML IJ SOLN
INTRAMUSCULAR | Status: AC
  Filled 2013-08-08: qty 2

## 2013-08-08 MED ORDER — ONDANSETRON HCL 4 MG/2ML IJ SOLN
INTRAMUSCULAR | Status: AC
  Filled 2013-08-08: qty 2

## 2013-08-08 MED ORDER — PROPOFOL 10 MG/ML IV EMUL
INTRAVENOUS | Status: AC
  Filled 2013-08-08: qty 20

## 2013-08-08 MED ORDER — BUPIVACAINE-EPINEPHRINE 0.25% -1:200000 IJ SOLN
INTRAMUSCULAR | Status: DC | PRN
  Administered 2013-08-08: 20 mL

## 2013-08-08 MED ORDER — METOCLOPRAMIDE HCL 5 MG/ML IJ SOLN: 10.0000 mg | Freq: Once | INTRAMUSCULAR | Status: DC | PRN

## 2013-08-08 MED ORDER — MEPERIDINE HCL 25 MG/ML IJ SOLN: 6.2500 mg | INTRAMUSCULAR | Status: DC | PRN

## 2013-08-08 MED ORDER — FENTANYL CITRATE 0.05 MG/ML IJ SOLN
INTRAMUSCULAR | Status: DC | PRN
  Administered 2013-08-08: 100 ug via INTRAVENOUS

## 2013-08-08 SURGICAL SUPPLY — 19 items
CANISTER SUCT 3000ML (MISCELLANEOUS) ×2 IMPLANT
CATH ROBINSON RED A/P 16FR (CATHETERS) ×2 IMPLANT
CLOTH BEACON ORANGE TIMEOUT ST (SAFETY) ×2 IMPLANT
CONTAINER PREFILL 10% NBF 60ML (FORM) ×4 IMPLANT
DECANTER SPIKE VIAL GLASS SM (MISCELLANEOUS) ×2 IMPLANT
DILATOR CANAL MILEX (MISCELLANEOUS) ×2 IMPLANT
DRESSING TELFA 8X3 (GAUZE/BANDAGES/DRESSINGS) ×2 IMPLANT
ELECT REM PT RETURN 9FT ADLT (ELECTROSURGICAL)
ELECTRODE REM PT RTRN 9FT ADLT (ELECTROSURGICAL) IMPLANT
GLOVE BIOGEL PI IND STRL 7.0 (GLOVE) ×1 IMPLANT
GLOVE BIOGEL PI INDICATOR 7.0 (GLOVE) ×1
GLOVE ECLIPSE 7.0 STRL STRAW (GLOVE) ×4 IMPLANT
GOWN PREVENTION PLUS XLARGE (GOWN DISPOSABLE) ×2 IMPLANT
GOWN STRL REIN XL XLG (GOWN DISPOSABLE) ×4 IMPLANT
LOOP ANGLED CUTTING 22FR (CUTTING LOOP) IMPLANT
PACK HYSTEROSCOPY LF (CUSTOM PROCEDURE TRAY) ×2 IMPLANT
PAD OB MATERNITY 4.3X12.25 (PERSONAL CARE ITEMS) ×2 IMPLANT
TOWEL OR 17X24 6PK STRL BLUE (TOWEL DISPOSABLE) ×4 IMPLANT
WATER STERILE IRR 1000ML POUR (IV SOLUTION) ×2 IMPLANT

## 2013-08-08 NOTE — Anesthesia Preprocedure Evaluation (Signed)
Anesthesia Evaluation  Patient identified by MRN, date of birth, ID band Patient awake    Reviewed: Allergy & Precautions, H&P , NPO status , Patient's Chart, lab work & pertinent test results  Airway Mallampati: II TM Distance: >3 FB Neck ROM: Full    Dental no notable dental hx. (+) Teeth Intact   Pulmonary neg pulmonary ROS,  breath sounds clear to auscultation  Pulmonary exam normal       Cardiovascular negative cardio ROS  Rhythm:Regular Rate:Normal     Neuro/Psych  Headaches, PSYCHIATRIC DISORDERS Anxiety Depression  Neuromuscular disease    GI/Hepatic negative GI ROS, Neg liver ROS,   Endo/Other  negative endocrine ROS  Renal/GU negative Renal ROS  negative genitourinary   Musculoskeletal  (+) Arthritis -, Osteoarthritis,    Abdominal   Peds  Hematology negative hematology ROS (+)   Anesthesia Other Findings   Reproductive/Obstetrics Post menopausal bleeding                           Anesthesia Physical Anesthesia Plan  ASA: II  Anesthesia Plan: General   Post-op Pain Management:    Induction: Intravenous  Airway Management Planned: LMA  Additional Equipment:   Intra-op Plan:   Post-operative Plan: Extubation in OR  Informed Consent: I have reviewed the patients History and Physical, chart, labs and discussed the procedure including the risks, benefits and alternatives for the proposed anesthesia with the patient or authorized representative who has indicated his/her understanding and acceptance.   Dental advisory given  Plan Discussed with: CRNA, Anesthesiologist and Surgeon  Anesthesia Plan Comments:         Anesthesia Quick Evaluation

## 2013-08-08 NOTE — Op Note (Signed)
Preoperative diagnoses:  Post-menopausal bleeding, Thickened endometrium  Postoperative diagnosis: same, plus endometrial polyp and fibroid  Procedure: D & C, hysteroscopy, polypectomy  Surgeon: Shelbie Proctor. Shawnie Pons, MD  Anesthesia: Tyrone Apple. Malen Gauze, MD  Findings: endometrial polyp and fibroid  Estimated blood loss: Minimum  Fluid deficit: -45 cc  Pathology: Endometrial curettings, endometrial polyp sent together to pathology  Indications for procedure: 52 y.o. G0 with history of post-menopausal bleeding, who had a thickened endometrium and failed office endometrial sampling who presents for diagnostic procedure.  Procedure: The patient was taken to the operating room where spinal analgesia was inserted.  SCDs were in place.  Time out was performed. Patient was placed in dorsolithotomy in [place] stirrups. She was prepped and draped in the usual sterile fashion. A Red Rubber catheter was used to drain her bladder. A speculum was placed in the vagina. The cervix was visualized anteriorly and grasped with a single-tooth tenaculum. Paracervical block was performed with 0.25% Marcaine with Epinephrine with 20 cc injected. Patient had taken Cytotec, but cervix could not be penetrated with smallest dilator or sound.  Os finder used and cervical os found. The uterus sounded to 7 cm. Sequential dilation was performed with Shawnie Pons dilators. The hysteroscope was inserted and the endometrial cavity and inspected. There were above findings noted in the endometrial cavity with both ostia seen.  The hysteroscope was removed. Polyp forceps used to removed all of the polyp and the fibroid as well. Sharp curettage was performed in all 4 quadrants. The hysteroscope was returned to the cavity to ensure specimen removed adequately. All instruments were removed from the vagina. All instrument, needle and lap counts were correct x2. The patient was awakened and is recovering in stable condition.  Mattheu Brodersen S  MD 08/08/2013 9:28 AM

## 2013-08-08 NOTE — Anesthesia Postprocedure Evaluation (Signed)
  Anesthesia Post-op Note  Patient: Cassandra Ford  Procedure(s) Performed: Procedure(s): DILATATION AND CURETTAGE /HYSTEROSCOPY (N/A)  Patient Location: PACU  Anesthesia Type:General  Level of Consciousness: awake, alert  and oriented  Airway and Oxygen Therapy: Patient Spontanous Breathing  Post-op Pain: none  Post-op Assessment: Post-op Vital signs reviewed, Patient's Cardiovascular Status Stable, Respiratory Function Stable, Patent Airway, No signs of Nausea or vomiting and Pain level controlled  Post-op Vital Signs: Reviewed and stable  Complications: No apparent anesthesia complications

## 2013-08-08 NOTE — Transfer of Care (Signed)
Immediate Anesthesia Transfer of Care Note  Patient: Cassandra Ford  Procedure(s) Performed: Procedure(s): DILATATION AND CURETTAGE /HYSTEROSCOPY (N/A)  Patient Location: PACU  Anesthesia Type:General  Level of Consciousness: awake, alert  and oriented  Airway & Oxygen Therapy: Patient Spontanous Breathing and Patient connected to nasal cannula oxygen  Post-op Assessment: Report given to PACU RN and Post -op Vital signs reviewed and stable  Post vital signs: stable  Complications: No apparent anesthesia complications

## 2013-08-08 NOTE — H&P (Signed)
Cassandra Ford is an 52 y.o.  female.    Chief Complaint: Post-menopausal bleeding  HPI: Presents with thickened endometrium (19.4 mm) post-menopausal bleeding and failed in office endometrial sampling.  For diagnostic procedure. Required po cytotec.  Having cramping right now.  Past Medical History  Diagnosis Date  . Allergy     to dust mites  . Chronic constipation   . Migraines   . Anxiety   . Infertility     Past Surgical History  Procedure Laterality Date  . C5-c6 fusion    . Mri cspine- cervical spondylosis, narrow foramen    . Mri l spine- l4-5 bulge, sm central tear    . Wisdom tooth extraction      Family History  Problem Relation Age of Onset  . Diabetes Mother   . Melanoma Sister    Social History:  reports that she quit smoking about 10 years ago. Her smoking use included Cigarettes. She smoked 0.25 packs per day. She has never used smokeless tobacco. She reports that she does not drink alcohol or use illicit drugs.  Allergies:  Allergies  Allergen Reactions  . Sertraline Hcl     Put her in a coma -per pt.    Medications Prior to Admission  Medication Sig Dispense Refill  . ALPRAZolam (XANAX) 1 MG tablet TAKE 1 TABLET AT BEDTIME AS NEEDED FOR ANXIETY  30 tablet  5  . cyclobenzaprine (FLEXERIL) 5 MG tablet Take 10 mg by mouth at bedtime as needed. TAKE 1 TO 2 TABLETS AT BEDTIME AS NEEDED SPASMS      . misoprostol (CYTOTEC) 200 MCG tablet Take 2 tablets (400 mcg total) by mouth 2 (two) times daily. Take 400 the night prior to procedure and 400 the morning of procedure with a sip of water.  4 tablet  0  . Multiple Vitamin (MULTIVITAMIN WITH MINERALS) TABS tablet Take 1 tablet by mouth daily.      [name] OVER THE COUNTER MEDICATION Take 1 tablet by mouth 2 (two) times daily. Alka Seltzer Plus Severe Sinus congestion and cough Daytime and night time pill that she takes.      . Phenylephrine-Aspirin (ALKA-SELTZER PLUS SINUS PO) Take by mouth.      . rizatriptan  (MAXALT) 10 MG tablet Take 1 tablet (10 mg total) by mouth as needed. May repeat in 2 hours if needed  12 tablet  3  . topiramate (TOPAMAX) 100 MG tablet Take 1 tablet (100 mg total) by mouth daily.  30 tablet  3     A comprehensive review of systems was negative.  Blood pressure 108/69, pulse 80, resp. rate 18, height 5\' 5"  (1.651 m), weight 170 lb (77.111 kg), last menstrual period 09/19/2012, SpO2 99.00%. BP 108/69  Pulse 80  Resp 18  Ht 5\' 5"  (1.651 m)  Wt 170 lb (77.111 kg)  BMI 28.29 kg/m2  SpO2 99%  LMP 09/19/2012 General appearance: alert, cooperative and appears stated age Head: Normocephalic, without obvious abnormality, atraumatic Neck: supple, symmetrical, trachea midline Lungs: normal effort Heart: regular rate and rhythm Abdomen: soft, non-tender; bowel sounds normal; no masses,  no organomegaly Extremities: extremities normal, atraumatic, no cyanosis or edema Skin: Skin color, texture, turgor normal. No rashes or lesions Neurologic: Grossly normal   Lab Results  Component Value Date   WBC 7.3 08/01/2013   HGB 15.3* 08/01/2013   HCT 43.8 08/01/2013   MCV 85.4 08/01/2013   PLT 271 08/01/2013   Lab Results  Component Value Date  PREGTESTUR NEGATIVE 08/08/2013     Assessment/Plan Patient Active Problem List   Diagnosis Date Noted  . Postmenopausal bleeding 07/22/2013  . DDD (degenerative disc disease), lumbar 04/15/2011  . MEMORY LOSS 11/20/2010  . KNEE PAIN, RIGHT 09/11/2010  . OBESITY, UNSPECIFIED 03/05/2010  . MIGRAINE WITHOUT AURA 01/21/2010  . DISC DISEASE, CERVICAL 01/08/2010  . CONSTIPATION 09/17/2009  . 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 FIBROMYALGIA 05/31/2007  . PALPITATIONS 05/31/2007  . DEPRESSION, MAJOR, RECURRENT 07/14/2006  . ANXIETY 07/14/2006  . MEDIAN NERVE PALSY OR CARPAL TUNNEL 07/14/2006  . RHINITIS, ALLERGIC 07/14/2006   For Dilation and Curettage with Hysteroscopy. Risks include but are not limited to bleeding, infection,  injury to surrounding structures, including bowel, bladder, blood clots, and death.  Likelihood of success is high.  Cassandra Ford 08/08/2013, 8:10 AM

## 2013-08-09 ENCOUNTER — Telehealth: Payer: Self-pay | Admitting: *Deleted

## 2013-08-09 ENCOUNTER — Encounter (HOSPITAL_COMMUNITY): Payer: Self-pay | Admitting: Family Medicine

## 2013-08-09 NOTE — Telephone Encounter (Signed)
Message copied by Granville Lewis on Tue Aug 09, 2013  2:51 PM ------      Message from: Reva Bores      Created: Tue Aug 09, 2013  1:00 PM       Her pathology is benign--please call and inform her. ------

## 2013-08-09 NOTE — Telephone Encounter (Signed)
Pt notified of normal pathology report from her D and C and hysteroscopy.

## 2013-09-18 ENCOUNTER — Other Ambulatory Visit: Payer: Self-pay | Admitting: Family Medicine

## 2013-09-27 ENCOUNTER — Encounter: Payer: BC Managed Care – PPO | Admitting: Family Medicine

## 2013-10-20 ENCOUNTER — Encounter: Payer: Self-pay | Admitting: General Practice

## 2013-10-20 ENCOUNTER — Encounter: Payer: Self-pay | Admitting: Family Medicine

## 2013-10-20 ENCOUNTER — Telehealth: Payer: Self-pay | Admitting: General Practice

## 2013-10-20 ENCOUNTER — Ambulatory Visit (INDEPENDENT_AMBULATORY_CARE_PROVIDER_SITE_OTHER): Payer: BC Managed Care – PPO | Admitting: Family Medicine

## 2013-10-20 VITALS — BP 124/79 | HR 85 | Temp 96.9°F | Ht 64.0 in | Wt 173.5 lb

## 2013-10-20 DIAGNOSIS — J309 Allergic rhinitis, unspecified: Secondary | ICD-10-CM

## 2013-10-20 DIAGNOSIS — N950 Postmenopausal bleeding: Secondary | ICD-10-CM

## 2013-10-20 DIAGNOSIS — J0100 Acute maxillary sinusitis, unspecified: Secondary | ICD-10-CM

## 2013-10-20 MED ORDER — AZITHROMYCIN 250 MG PO TABS: 250.0000 mg | ORAL_TABLET | Freq: Every day | ORAL | Status: DC

## 2013-10-20 NOTE — Progress Notes (Addendum)
   Subjective:    Patient ID: Cassandra Ford, female    DOB: 09-26-1961, 53 y.o.   MRN: [id number]  Sinusitis This is a chronic problem. The current episode started more than 1 month ago. The problem has been waxing and waning since onset. There has been no fever. The pain is moderate. Associated symptoms include headaches and sinus pressure. Pertinent negatives include no coughing, shortness of breath or sore throat. Past treatments include acetaminophen. The treatment provided no relief.    Here for f/u following hysteroscopy and polypectomy and fibroid removal after post-menopausal bleeding and thickened endometrium.  No evidence of cancer noted on pathology.  No further bleeding.  Continues to have cramping.  She is thinking she might need to have a hysterectomy to help with her migraine headaches, which have gotten much worse since she transitioned through menopause.  Review of Systems  Constitutional: Negative for fever.  HENT: Positive for sinus pressure. Negative for sore throat.   Respiratory: Negative for cough and shortness of breath.   Gastrointestinal: Negative for vomiting, diarrhea and constipation.  Neurological: Positive for headaches.       Objective:   Physical Exam  Vitals reviewed. Constitutional: She appears well-developed and well-nourished.  Eyes: No scleral icterus.  Neck: Normal range of motion. No thyromegaly present.  Cardiovascular: Normal rate.   Pulmonary/Chest: Effort normal.  Abdominal: Soft. There is no tenderness.  Genitourinary: Vagina normal.  Musculoskeletal: Normal range of motion.  Neurological: She is alert.  Skin: Skin is warm and dry. No rash noted.  Psychiatric: She has a normal mood and affect.          Assessment & Plan:

## 2013-10-20 NOTE — Patient Instructions (Signed)

## 2013-10-20 NOTE — Telephone Encounter (Signed)
Patient called and left message stating she is a patient of Dr Virginia Crews and was seen here today and her and Dr Kennon Rounds talked about writing her a note that she is cleared to work with the public if her work needed it and her work is requesting a note as soon as possible and we can fax that attention to [facility] at [phone]. Called Dr Kennon Rounds at [facility] who said okay to write note clearing patient to work with the public. Called patient stated that I would get the note faxed over for her. Patient verbalized understanding and asked where she should make a followup appt. Told patient to call us back at the end of March early April to see which office Dr Kennon Rounds would be at so she can get seen by her. Patient verbalized understanding to all and had no further questions

## 2013-10-21 NOTE — Assessment & Plan Note (Signed)
Discussed options, at present I do not think hysterectomy is indicated.  She seemed to understand this.  Will f/u in 3 months.  Return with bleeding.

## 2013-10-21 NOTE — Assessment & Plan Note (Signed)
Zithromax called in

## 2013-10-25 ENCOUNTER — Other Ambulatory Visit: Payer: Self-pay | Admitting: Family Medicine

## 2013-12-09 ENCOUNTER — Ambulatory Visit (INDEPENDENT_AMBULATORY_CARE_PROVIDER_SITE_OTHER): Payer: BC Managed Care – PPO | Admitting: Family Medicine

## 2013-12-09 ENCOUNTER — Encounter: Payer: Self-pay | Admitting: Family Medicine

## 2013-12-09 VITALS — BP 116/69 | HR 91 | Temp 97.0°F | Wt 177.0 lb

## 2013-12-09 DIAGNOSIS — F419 Anxiety disorder, unspecified: Secondary | ICD-10-CM

## 2013-12-09 DIAGNOSIS — Z13220 Encounter for screening for lipoid disorders: Secondary | ICD-10-CM

## 2013-12-09 DIAGNOSIS — F411 Generalized anxiety disorder: Secondary | ICD-10-CM

## 2013-12-09 DIAGNOSIS — J019 Acute sinusitis, unspecified: Secondary | ICD-10-CM

## 2013-12-09 LAB — COMPLETE METABOLIC PANEL WITH GFR
ALT: 17 U/L (ref 0–35)
AST: 19 U/L (ref 0–37)
Albumin: 4.7 g/dL (ref 3.5–5.2)
Alkaline Phosphatase: 81 U/L (ref 39–117)
BUN: 12 mg/dL (ref 6–23)
CO2: 26 meq/L (ref 19–32)
CREATININE: 0.89 mg/dL (ref 0.50–1.10)
Calcium: 9.7 mg/dL (ref 8.4–10.5)
Chloride: 105 mEq/L (ref 96–112)
GFR, Est African American: 87 mL/min
GFR, Est Non African American: 75 mL/min
Glucose, Bld: 87 mg/dL (ref 70–99)
POTASSIUM: 4.4 meq/L (ref 3.5–5.3)
SODIUM: 141 meq/L (ref 135–145)
TOTAL PROTEIN: 7.2 g/dL (ref 6.0–8.3)
Total Bilirubin: 0.4 mg/dL (ref 0.2–1.2)

## 2013-12-09 LAB — LIPID PANEL
Cholesterol: 182 mg/dL (ref 0–200)
HDL: 68 mg/dL (ref >39)
LDL Cholesterol: 97 mg/dL (ref 0–99)
TRIGLYCERIDES: 86 mg/dL (ref <150)
Total CHOL/HDL Ratio: 2.7 Ratio
VLDL: 17 mg/dL (ref 0–40)

## 2013-12-09 MED ORDER — ALPRAZOLAM 1 MG PO TABS: 1.0000 mg | ORAL_TABLET | Freq: Two times a day (BID) | ORAL | Status: DC | PRN

## 2013-12-09 NOTE — Progress Notes (Signed)
   Subjective:    Patient ID: Cassandra Ford, female    DOB: 10-17-1960, 53 y.o.   MRN: [id number]  HPI Here for sinus pressure.  Recently seen for sinusitis and given zpack which did help temporarily. Getting green mucous. Bilat facial pain under her eyes x 1 week.  No fever, chills, or sweats.    Has been under a lot of stress lately. Her best friend of 18 years passed away. And she has had multiple things happen. Her hot water heater foot at her house. A tree landed on her house. A screw punctured her titer. In other stressful things. She wants and if it would be okay to increase her Xanax to twice a day as needed occasionally. Review of Systems     Objective:   Physical Exam  Constitutional: She is oriented to person, place, and time. She appears well-developed and well-nourished.  HENT:  Head: Normocephalic and atraumatic.  Right Ear: External ear normal.  Left Ear: External ear normal.  Nose: Nose normal.  Mouth/Throat: Oropharynx is clear and moist.  TMs and canals are clear.   Eyes: Conjunctivae and EOM are normal. Pupils are equal, round, and reactive to light.  Neck: Neck supple. No thyromegaly present.  Cardiovascular: Normal rate, regular rhythm and normal heart sounds.   Pulmonary/Chest: Effort normal and breath sounds normal. She has no wheezes.  Lymphadenopathy:    She has no cervical adenopathy.  Neurological: She is alert and oriented to person, place, and time.  Skin: Skin is warm and dry.  Psychiatric: She has a normal mood and affect.          Assessment & Plan:  Acute sinusitis- failed tx with zpack.  Will put her on bactrim DS.  Ok to use symptomatic care. Call if not better in one week.  Acute situational stress-we discussed that possibly seeing a therapist or counselor to be very helpful. She is not interested at this time. We'll temporarily increase the next twice a day as needed. Warned her about potential for addiction and reminded her that she should  go back down as soon as she is doing better.  Due for screening lipid panel as well. Order placed.  Time spent 25 minutes, >50% spent of the time spent counseling about situational stress and sinusitis.

## 2013-12-12 ENCOUNTER — Other Ambulatory Visit: Payer: Self-pay | Admitting: Family Medicine

## 2013-12-12 MED ORDER — SULFAMETHOXAZOLE-TMP DS 800-160 MG PO TABS: 1.0000 | ORAL_TABLET | Freq: Two times a day (BID) | ORAL | Status: DC

## 2013-12-12 NOTE — Progress Notes (Signed)
Quick Note:  All labs are normal. ______ 

## 2013-12-15 ENCOUNTER — Telehealth: Payer: Self-pay | Admitting: *Deleted

## 2013-12-15 ENCOUNTER — Encounter: Payer: Self-pay | Admitting: *Deleted

## 2013-12-15 MED ORDER — MOXIFLOXACIN HCL 400 MG PO TABS: 400.0000 mg | ORAL_TABLET | Freq: Every day | ORAL | Status: DC

## 2013-12-15 NOTE — Telephone Encounter (Signed)
The antibiotic was for 10 days and I just prescribed 4 days ago. There's no way she has completed it. But since she has not had a response after 4 days I will go ahead and prescribe a fluoroquinolone which should be stronger.

## 2013-12-15 NOTE — Telephone Encounter (Signed)
Pt informed. She reports that her fever has been up and down. At last check her temp was 100. She also stated that she has been having body aches along with fever and chills. I advised her to alternate between tylenol and motrin, vitamin C, hydrate, good hand hygiene. She will need a note for work and asked that this is faxed to [phone] Attn: Susan/Mary/Ashley. I told her that I would forward to Dr. Madilyn Fireman for her advice.Cassandra Ford

## 2013-12-15 NOTE — Telephone Encounter (Signed)
Pt stated that she went into work and they checked her temp it was 101.0. She has missed Monday and Tuesday and has had to call out today because she is not any better. Her sinuses are painful she has a cough, and is experiencing fever and chills. She has finished her ABX. Please advise.Cassandra Ford

## 2013-12-16 NOTE — Telephone Encounter (Signed)
OK for work note to return on Monday

## 2013-12-16 NOTE — Telephone Encounter (Signed)
Note faxed.Cassandra Ford

## 2013-12-18 ENCOUNTER — Other Ambulatory Visit: Payer: Self-pay | Admitting: Family Medicine

## 2013-12-20 ENCOUNTER — Other Ambulatory Visit: Payer: Self-pay | Admitting: Family Medicine

## 2013-12-23 ENCOUNTER — Ambulatory Visit: Payer: BC Managed Care – PPO | Admitting: Family Medicine

## 2014-02-26 ENCOUNTER — Other Ambulatory Visit: Payer: Self-pay | Admitting: Family Medicine

## 2014-03-13 ENCOUNTER — Other Ambulatory Visit: Payer: Self-pay | Admitting: Family Medicine

## 2014-05-12 ENCOUNTER — Other Ambulatory Visit: Payer: Self-pay | Admitting: Family Medicine

## 2014-06-07 ENCOUNTER — Other Ambulatory Visit: Payer: Self-pay | Admitting: Family Medicine

## 2014-06-15 ENCOUNTER — Telehealth: Payer: Self-pay | Admitting: *Deleted

## 2014-06-15 ENCOUNTER — Other Ambulatory Visit: Payer: Self-pay | Admitting: Family Medicine

## 2014-06-15 NOTE — Telephone Encounter (Signed)
Flexeril refill request denied from [facility] today. It was just refilled on 06/08/14. Patient had 2 scripts in her chart for flexeril. Margette Fast, CMA

## 2014-06-30 ENCOUNTER — Encounter: Payer: Self-pay | Admitting: Family Medicine

## 2014-06-30 ENCOUNTER — Other Ambulatory Visit: Payer: Self-pay | Admitting: Family Medicine

## 2014-06-30 ENCOUNTER — Ambulatory Visit (INDEPENDENT_AMBULATORY_CARE_PROVIDER_SITE_OTHER): Payer: BC Managed Care – PPO | Admitting: Family Medicine

## 2014-06-30 VITALS — BP 125/86 | HR 83 | Wt 185.0 lb

## 2014-06-30 DIAGNOSIS — R5381 Other malaise: Secondary | ICD-10-CM | POA: Diagnosis not present

## 2014-06-30 DIAGNOSIS — R739 Hyperglycemia, unspecified: Secondary | ICD-10-CM

## 2014-06-30 DIAGNOSIS — R5383 Other fatigue: Principal | ICD-10-CM

## 2014-06-30 DIAGNOSIS — R7309 Other abnormal glucose: Secondary | ICD-10-CM | POA: Diagnosis not present

## 2014-06-30 DIAGNOSIS — R7301 Impaired fasting glucose: Secondary | ICD-10-CM | POA: Insufficient documentation

## 2014-06-30 DIAGNOSIS — Z833 Family history of diabetes mellitus: Secondary | ICD-10-CM

## 2014-06-30 DIAGNOSIS — G4700 Insomnia, unspecified: Secondary | ICD-10-CM

## 2014-06-30 DIAGNOSIS — R350 Frequency of micturition: Secondary | ICD-10-CM | POA: Diagnosis not present

## 2014-06-30 LAB — POCT URINALYSIS DIPSTICK
Bilirubin, UA: NEGATIVE
Glucose, UA: NEGATIVE
Ketones, UA: NEGATIVE
NITRITE UA: NEGATIVE
PROTEIN UA: NEGATIVE
SPEC GRAV UA: 1.015
Urobilinogen, UA: 1
pH, UA: 6.5

## 2014-06-30 LAB — POCT GLYCOSYLATED HEMOGLOBIN (HGB A1C): HEMOGLOBIN A1C: 5.7

## 2014-06-30 MED ORDER — CYCLOBENZAPRINE HCL 5 MG PO TABS: ORAL_TABLET | ORAL | Status: DC

## 2014-06-30 MED ORDER — ALPRAZOLAM 1 MG PO TABS: ORAL_TABLET | ORAL | Status: DC

## 2014-06-30 MED ORDER — RIZATRIPTAN BENZOATE 10 MG PO TABS: 10.0000 mg | ORAL_TABLET | ORAL | Status: DC | PRN

## 2014-06-30 NOTE — Addendum Note (Signed)
Addended by: Narda Rutherford on: 06/30/2014 10:50 AM   Modules accepted: Orders

## 2014-06-30 NOTE — Assessment & Plan Note (Signed)
New Dx.  F/u in 6 moths. Work on low sugar and low carb diet.

## 2014-06-30 NOTE — Progress Notes (Signed)
Subjective:    Patient ID: Cassandra Ford, female    DOB: [birth year], 53 y.o.   MRN: [id number]  Urinary Frequency  Associated symptoms include frequency.   Has been having vision changes and noticed some increased frequency.  Says her lenses change every time goes ot see her eye doctor.  + fam hx of diabetes. She requests to be tested.  Has been really fatigued x 6 months.  Says over sleeping.  Says not really cleaning her house bc so fatigued by the time she comes home from work.  She has been taking b supplements for several months.  She's not sure why. She's never really felt like this before. She says everything just feels like it's a physical effort. No prior history of anemia. She says she's tried multiple things over-the-counter after reading online about fatigue to try to get herself to feel better.  Insomnia - She is not sleeping well.  She says that we declined her muscle relaxer and she has not been on for most of months. She says she didn't like how much it was really helping her sleep.  Review of Systems  Genitourinary: Positive for frequency.       Objective:   Physical Exam  Constitutional: She is oriented to person, place, and time. She appears well-developed and well-nourished.  HENT:  Head: Normocephalic and atraumatic.  Right Ear: External ear normal.  Left Ear: External ear normal.  Nose: Nose normal.  Mouth/Throat: Oropharynx is clear and moist.  TMs and canals are clear.   Eyes: Conjunctivae and EOM are normal. Pupils are equal, round, and reactive to light.  Neck: Neck supple. No thyromegaly present.  Cardiovascular: Normal rate, regular rhythm and normal heart sounds.   Pulmonary/Chest: Effort normal and breath sounds normal. She has no wheezes.  Abdominal: Soft. Bowel sounds are normal. She exhibits no distension and no mass. There is no tenderness. There is no rebound and no guarding.  Musculoskeletal: She exhibits no edema.  Lymphadenopathy:    She  has no cervical adenopathy.  Neurological: She is alert and oriented to person, place, and time.  Skin: Skin is warm and dry.  Psychiatric: She has a normal mood and affect.          Assessment & Plan:  Declines flu shot.   Fatigue - unclear etiology at this point. We'll do some additional blood work for further evaluation to evaluate for thyroid, B12 deficiency, vitamin D, iron stores et Ronney Asters. We'll also check her liver and kidney function as well. Hemoglobin A1c was 5.7 in the impaired fasting glucose range. See discussion below. Her urinalysis was normal. Her vision was 20/40 in which case she does need correction and encouraged her to keep her regular appointment with her eye doctor. She's also not been sleeping well she ran out of her muscle relaxer which she uses as needed at bedtime. We sent a prescription on September 3 but evidently the pharmacy said they never received it and so she has been out of her medication from almost a month.  Impaired fasting glucose- discussed the diagnosis. We'll work on diet and exercise. Encouraged her to watch her intake of sugar and carbohydrates. We'll monitor again in 6 months. Overall think she has a pretty good diet so encouraged her to try to schedule regular exercise into her routine.  Insomnia-will restart her muscle relaxer at bedtime since this does seem to help her. Like to see her back in one month to make sure  that she's sleeping well and starting to feel like she's getting some more energy.

## 2014-07-01 LAB — CBC WITH DIFFERENTIAL/PLATELET
BASOS PCT: 0 % (ref 0–1)
Basophils Absolute: 0 10*3/uL (ref 0.0–0.1)
EOS ABS: 0.2 10*3/uL (ref 0.0–0.7)
Eosinophils Relative: 3 % (ref 0–5)
HEMATOCRIT: 44.4 % (ref 36.0–46.0)
Hemoglobin: 15.4 g/dL — ABNORMAL HIGH (ref 12.0–15.0)
Lymphocytes Relative: 48 % — ABNORMAL HIGH (ref 12–46)
Lymphs Abs: 2.8 10*3/uL (ref 0.7–4.0)
MCH: 29.1 pg (ref 26.0–34.0)
MCHC: 34.7 g/dL (ref 30.0–36.0)
MCV: 83.9 fL (ref 78.0–100.0)
MONOS PCT: 9 % (ref 3–12)
Monocytes Absolute: 0.5 10*3/uL (ref 0.1–1.0)
NEUTROS ABS: 2.4 10*3/uL (ref 1.7–7.7)
Neutrophils Relative %: 40 % — ABNORMAL LOW (ref 43–77)
Platelets: 315 10*3/uL (ref 150–400)
RBC: 5.29 MIL/uL — ABNORMAL HIGH (ref 3.87–5.11)
RDW: 14.5 % (ref 11.5–15.5)
WBC: 5.9 10*3/uL (ref 4.0–10.5)

## 2014-07-01 LAB — VITAMIN D 25 HYDROXY (VIT D DEFICIENCY, FRACTURES): VIT D 25 HYDROXY: 49 ng/mL (ref 30–89)

## 2014-07-01 LAB — COMPLETE METABOLIC PANEL WITH GFR
ALK PHOS: 85 U/L (ref 39–117)
ALT: 22 U/L (ref 0–35)
AST: 24 U/L (ref 0–37)
Albumin: 4.2 g/dL (ref 3.5–5.2)
BILIRUBIN TOTAL: 0.5 mg/dL (ref 0.2–1.2)
BUN: 13 mg/dL (ref 6–23)
CO2: 25 mEq/L (ref 19–32)
Calcium: 9.5 mg/dL (ref 8.4–10.5)
Chloride: 103 mEq/L (ref 96–112)
Creat: 0.89 mg/dL (ref 0.50–1.10)
GFR, Est African American: 86 mL/min
GFR, Est Non African American: 75 mL/min
Glucose, Bld: 81 mg/dL (ref 70–99)
Potassium: 4.1 mEq/L (ref 3.5–5.3)
SODIUM: 137 meq/L (ref 135–145)
TOTAL PROTEIN: 7.2 g/dL (ref 6.0–8.3)

## 2014-07-01 LAB — EPSTEIN-BARR VIRUS VCA, IGG: EBV VCA IgG: >750 U/mL — ABNORMAL HIGH (ref <18.0)

## 2014-07-01 LAB — VITAMIN B12: VITAMIN B 12: 876 pg/mL (ref 211–911)

## 2014-07-01 LAB — MAGNESIUM: Magnesium: 2.1 mg/dL (ref 1.5–2.5)

## 2014-07-01 LAB — EPSTEIN-BARR VIRUS VCA, IGM: EBV VCA IgM: <10 U/mL (ref <36.0)

## 2014-07-01 LAB — TSH: TSH: 1.848 u[IU]/mL (ref 0.350–4.500)

## 2014-07-01 LAB — CMV IGM

## 2014-07-01 LAB — FERRITIN: Ferritin: 81 ng/mL (ref 10–291)

## 2014-07-02 LAB — URINE CULTURE
Colony Count: NO GROWTH
Organism ID, Bacteria: NO GROWTH

## 2014-07-05 LAB — VITAMIN B6: Vitamin B6: 31 ng/mL — ABNORMAL HIGH (ref 2.1–21.7)

## 2014-07-08 ENCOUNTER — Other Ambulatory Visit: Payer: Self-pay | Admitting: Family Medicine

## 2014-07-10 ENCOUNTER — Telehealth: Payer: Self-pay | Admitting: *Deleted

## 2014-07-10 DIAGNOSIS — N200 Calculus of kidney: Secondary | ICD-10-CM

## 2014-07-10 LAB — VITAMIN B1

## 2014-07-10 NOTE — Telephone Encounter (Addendum)
Pt called and reported that she was seen at [facility] on Friday for abdominal pain and n/v. They did CT scan and found kidney stones. They advised that she f/u with Urology and GYN placed a referral for her for a urologist and GYN there in [place]. They also told her to screen her urine and if she passes the stone to bring it in for pathology testing. She asked how long it would be before she would pass the stone I told her that it is undetermined. I told her that if she should pass a stone to bring it in. Pt sated that she would like to have Dr. Gardiner Ramus input on what's going on.Audelia Hives [place]

## 2014-07-11 NOTE — Telephone Encounter (Signed)
We can certainly arrange for urology referral here locally.  We can try to get her in in the next week or so. Does she have a strainer to catch the stone? If not then we can give her one.  Dopes she need gyn referral here locally as well? WE can set her up next door.  Let see if we can get records on location and size of stone.  Most stones will pass in 1-2 weeks but sometimes they have to be "blasteed". That is where Urology may be helpful in determining when to be more proactive about treatment.

## 2014-07-12 NOTE — Telephone Encounter (Signed)
Pt had CT of abdomen and pelvis done at [facility] with the following results  Mild obstruction right side as detailed above. 2 or 3 mm obstructing stone at the right ureterovesical junction. 2. Stranding around the right kidney is most likely due to obstruction but please correlate for any signs of infection. 3. Uterine mass. Statistically this most likely represents a large fibroid. Recommend elective pelvic ultrasound for further evaluation..  Please note that without IV contrast, visualization of the visceral organs and other abdominal and pelvic structures is limited. Recommend followup as clinically indicated  Also she will need a work note to be faxed to [employer] [phone] for the days that she has missed. appt made for Monday.Cassandra Ford

## 2014-07-17 ENCOUNTER — Encounter: Payer: Self-pay | Admitting: Family Medicine

## 2014-07-17 ENCOUNTER — Ambulatory Visit (INDEPENDENT_AMBULATORY_CARE_PROVIDER_SITE_OTHER): Payer: BC Managed Care – PPO | Admitting: Family Medicine

## 2014-07-17 VITALS — BP 126/87 | HR 110 | Temp 97.8°F | Wt 184.0 lb

## 2014-07-17 DIAGNOSIS — R109 Unspecified abdominal pain: Secondary | ICD-10-CM | POA: Diagnosis not present

## 2014-07-17 DIAGNOSIS — R1900 Intra-abdominal and pelvic swelling, mass and lump, unspecified site: Secondary | ICD-10-CM

## 2014-07-17 DIAGNOSIS — N200 Calculus of kidney: Secondary | ICD-10-CM | POA: Diagnosis not present

## 2014-07-17 LAB — POCT URINALYSIS DIPSTICK
Bilirubin, UA: NEGATIVE
Blood, UA: NEGATIVE
GLUCOSE UA: NEGATIVE
Ketones, UA: NEGATIVE
NITRITE UA: NEGATIVE
Protein, UA: NEGATIVE
Spec Grav, UA: 1.015
UROBILINOGEN UA: 0.2
pH, UA: 7.5

## 2014-07-17 MED ORDER — TRAMADOL HCL 50 MG PO TABS: 50.0000 mg | ORAL_TABLET | Freq: Three times a day (TID) | ORAL | Status: DC | PRN

## 2014-07-17 NOTE — Progress Notes (Signed)
   Subjective:    Patient ID: Cassandra Ford, female    DOB: 08-Aug-1961, 53 y.o.   MRN: [id number]  HPI Here today for ED f/u for flank pain.  Has been using a urine strainer but hasn't caught a stone. She was give oxycodone but felt like it was too strong so stopped it 2 days ago.  She now have pelvic pain and pressure and still feels very nauseated.  Says tender suprapubically and has been using her nausea medicine. Says she has had some fever and chills.  Has been a little constipated and using some suppositories.   Pelvic CT showed:  "IMPRESSION:  1. Mild obstruction right side as detailed above. 2 or 3 mm obstructing  stone at the right ureterovesical junction.  2. Stranding around the right kidney is most likely due to obstruction but  please correlate for any signs of infection.  3. Uterine mass. Statistically this most likely represents a large fibroid.  Recommend elective pelvic ultrasound for further evaluation."    Review of Systems     Objective:   Physical Exam  Constitutional: She is oriented to person, place, and time. She appears well-developed and well-nourished.  HENT:  Head: Normocephalic and atraumatic.  Cardiovascular: Normal rate, regular rhythm and normal heart sounds.   Pulmonary/Chest: Effort normal and breath sounds normal.  Abdominal: Soft. Bowel sounds are normal. She exhibits no distension and no mass. There is tenderness. There is no rebound and no guarding.  Mild epigastric tenderness and suprapubic tenderness. Also tender in the left lower quadrant to  Neurological: She is alert and oriented to person, place, and time.  Skin: Skin is warm and dry.  Psychiatric: She has a normal mood and affect. Her behavior is normal.          Assessment & Plan:  Right sided kidney stones.  Has been 10 days.  Can be up to 2-3 weeks to pass the stone.  We called urology and they will schedule her for end of October.  Given a new strainer today. Will try tramadol  since the vicodin was too strong.  UA today is neg for blood .  Continue nausea medication prn.  Pain is 5-6/10 today  Pelvic mass - Will schedule for [place] for tomorrow.  She has a prior history of fibroids and in fact had surgery about a year ago to remove fibroids and several polyps. If we see significant fibroids on the ultrasound and we'll need to reschedule with Dr. Kennon Rounds.

## 2014-07-27 ENCOUNTER — Other Ambulatory Visit: Payer: Self-pay | Admitting: Family Medicine

## 2014-07-28 ENCOUNTER — Ambulatory Visit: Payer: BC Managed Care – PPO | Admitting: Family Medicine

## 2014-08-03 IMAGING — US US TRANSVAGINAL NON-OB
1 series · 13 of 25 positions shown · non-contrast
Comparison: None

CLINICAL DATA: Postmenopausal bleeding. No menses for 3 years. No
history of hormone replacement therapy.

EXAM:
TRANSABDOMINAL AND TRANSVAGINAL ULTRASOUND OF PELVIS
TECHNIQUE: Both transabdominal and transvaginal ultrasound examinations of the
pelvis were performed. Transabdominal technique was performed for
global imaging of the pelvis including uterus, ovaries, adnexal
regions, and pelvic cul-de-sac. It was necessary to proceed with
endovaginal exam following the transabdominal exam to visualize the
endometrium and ovaries to better advantage.

[Series 1: us transvaginal non-ob · 0.28mm/px · 13 of 58 slices shown]
[im 1/58]
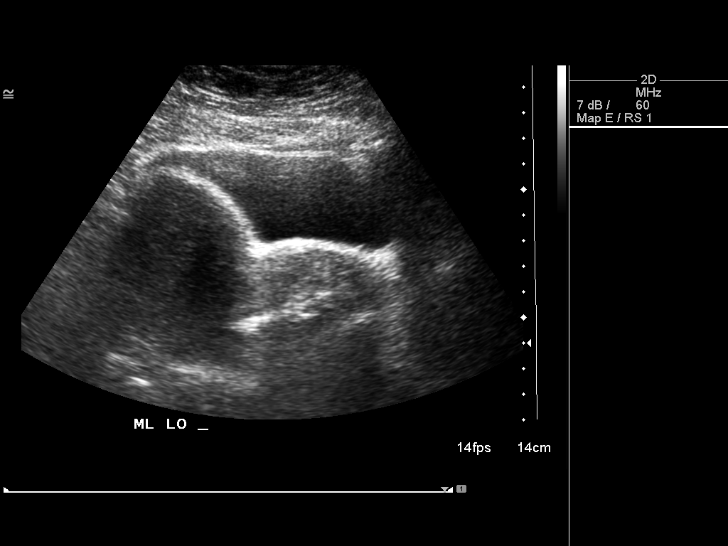
[im 5/58]
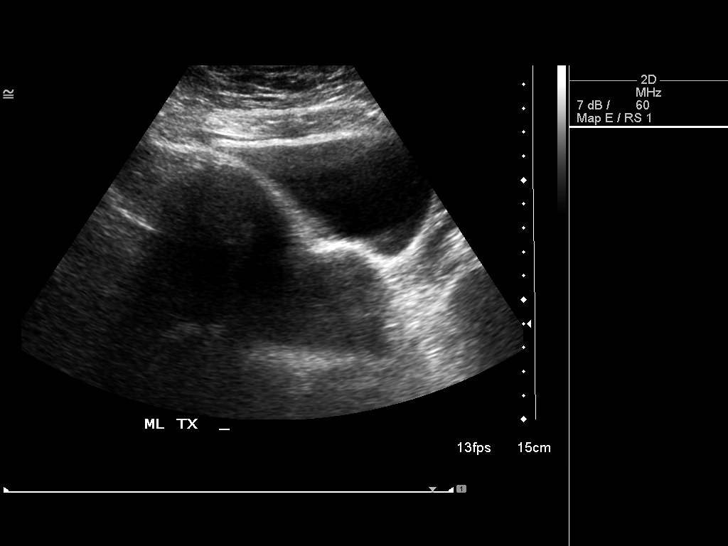
[im 10/58]
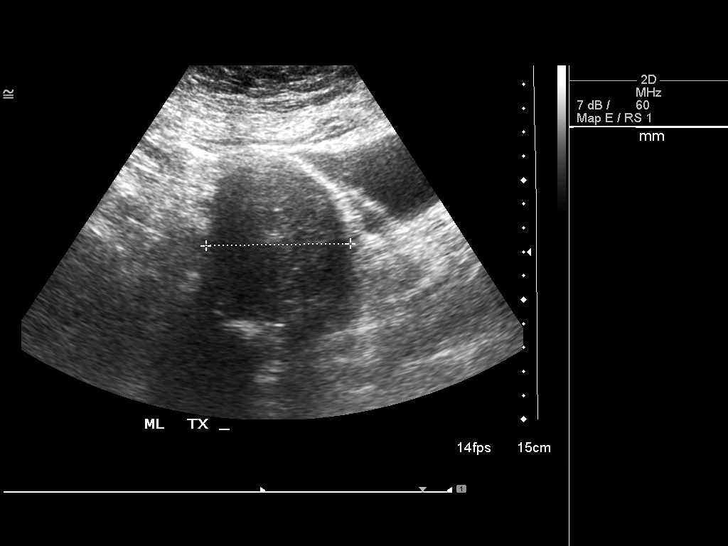
[im 15/58]
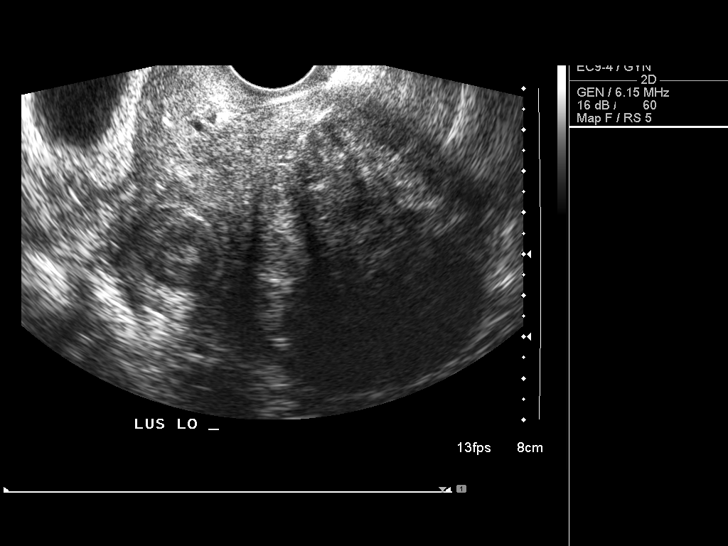
[im 20/58]
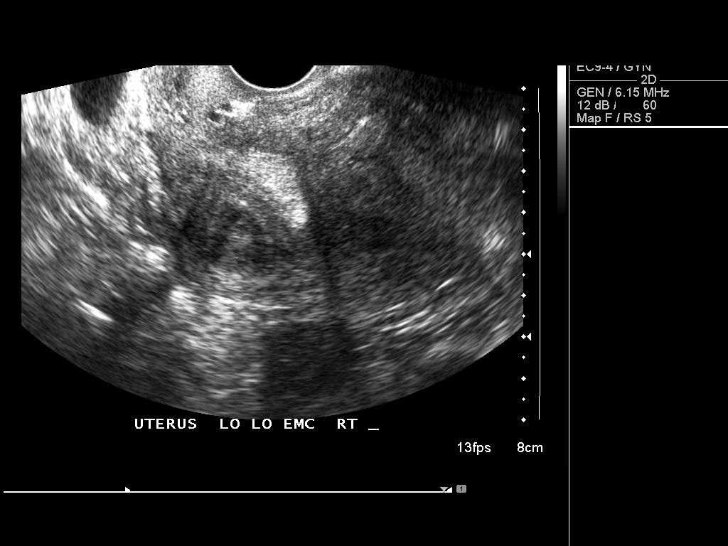
[im 24/58]
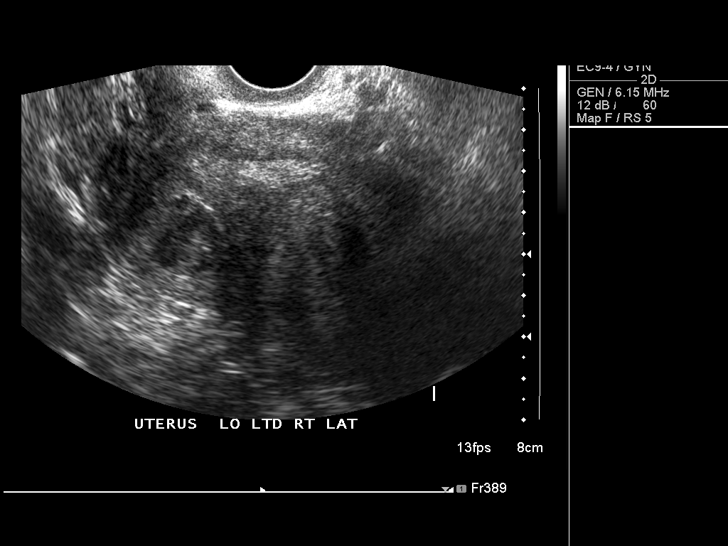
[im 29/58]
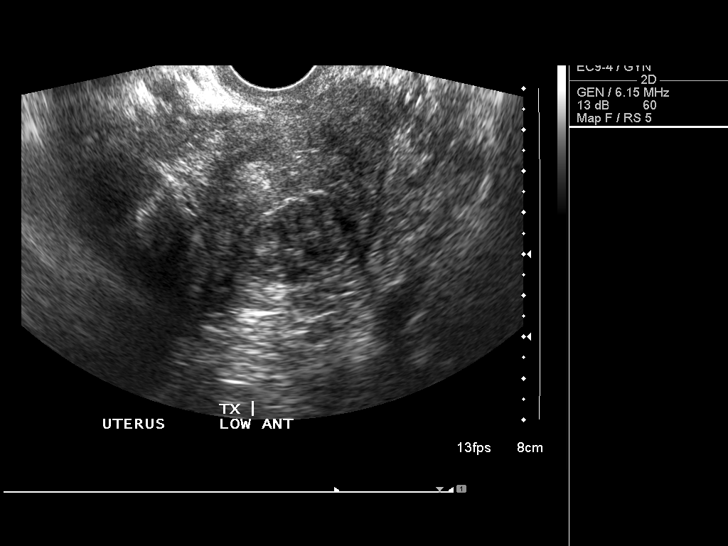
[im 34/58]
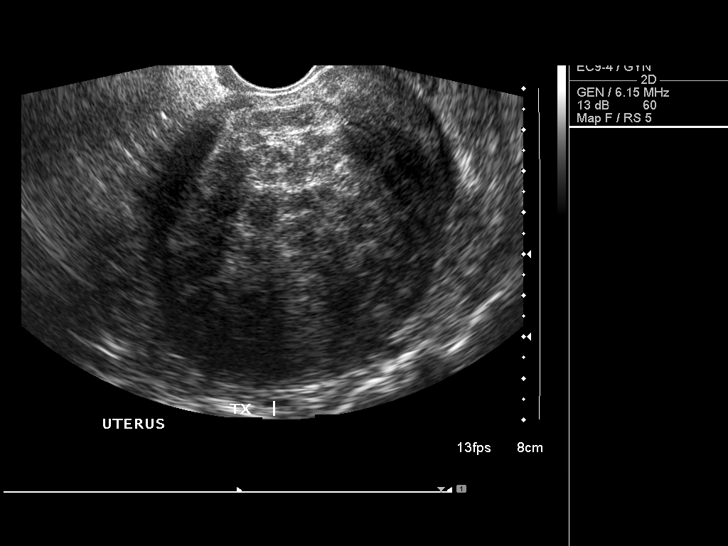
[im 39/58]
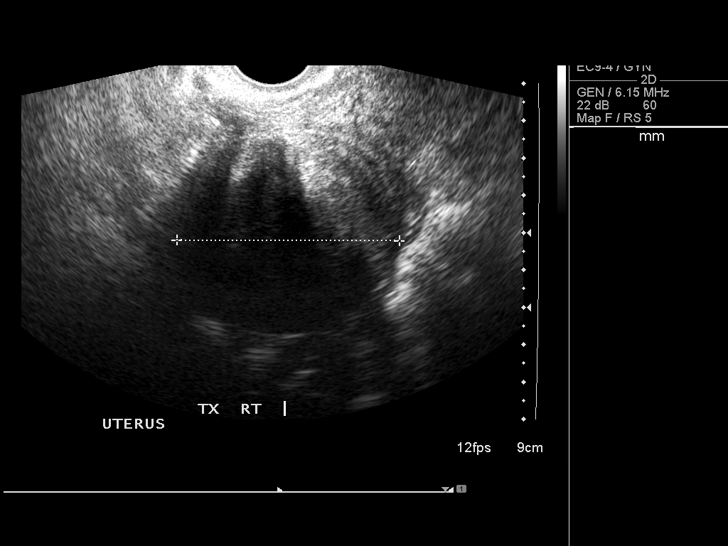
[im 43/58]
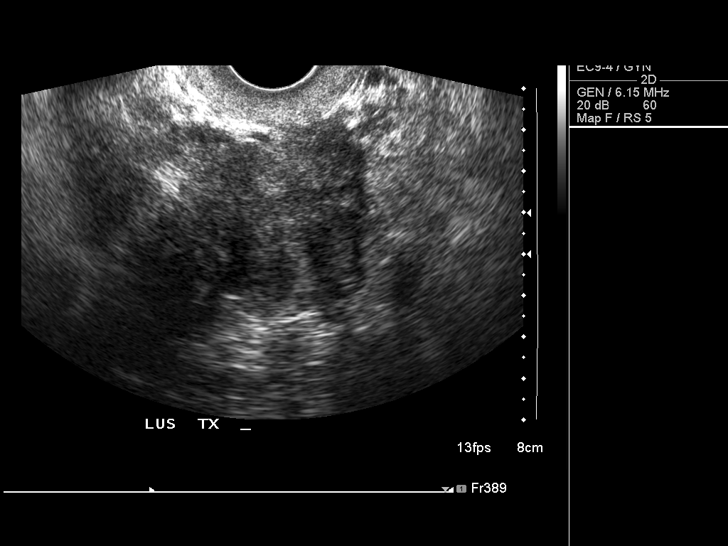
[im 48/58]
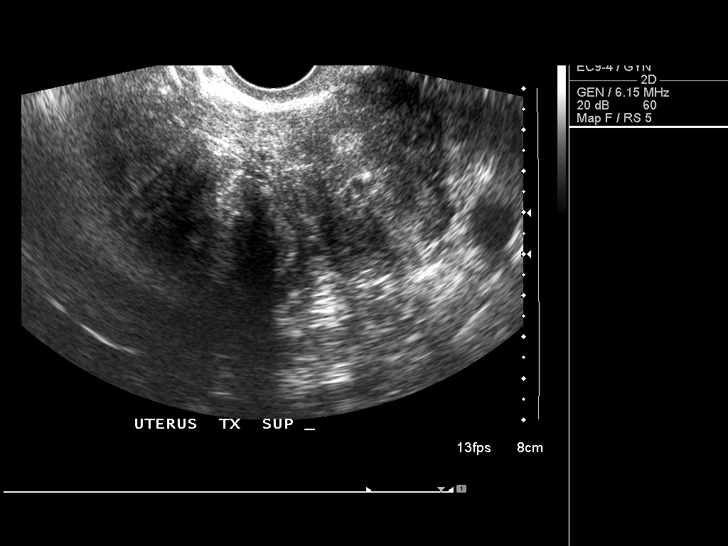
[im 53/58]
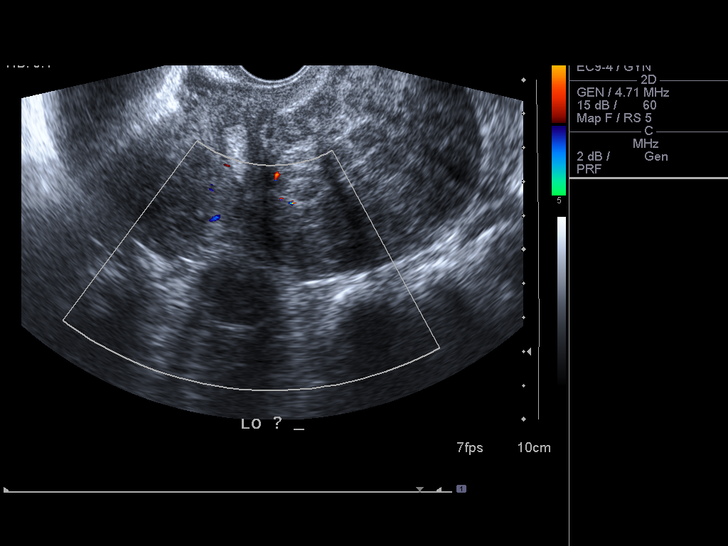
[im 58/58]
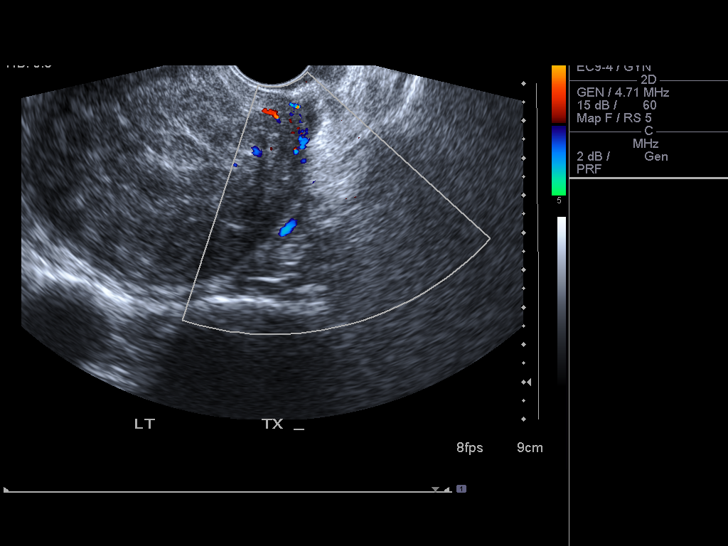

[13 of 25 positions shown; findings below may reference images not displayed]

FINDINGS: Uterus

Measurements: Suboptimally measured due to retroflexion, but
approximately 11.8 x 7.5 x 10.2 cm. Several intramural fibroids are
demonstrated. The largest is in the left fundal region, measuring
6.7 x 6.8 x 6.3 cm. There is a right fundal fibroid measuring 5.9 x
5.4 x 6.0 cm. At least 2 other smaller fibroids are noted.

Endometrium

Thickness: Homogeneously thickened to up to 19.4 mm. No focal
abnormality visualized.

Right ovary

Measurements: Not visualized. No adnexal mass demonstrated.

Left ovary

Measurements: Not visualized. No adnexal mass demonstrated.

Other findings

No free fluid.
IMPRESSION: 1. Retroflexed uterus with multiple intramural fibroids measuring up
to 6.8 cm in diameter.
2. Homogeneous thickening of the endometrium to 19 mm. In the
setting of post-menopausal bleeding, endometrial sampling is
indicated to exclude carcinoma. If results are benign,
sonohysterogram should be considered for focal lesion work-up. (Ref:
Radiological Reasoning: Algorithmic Workup of Abnormal Vaginal
Bleeding with Endovaginal Sonography and Sonohysterography. AJR
5223; 191:S68-73).
3. Neither ovary is visualized.

## 2014-08-04 ENCOUNTER — Other Ambulatory Visit: Payer: Self-pay | Admitting: Urology

## 2014-08-04 ENCOUNTER — Other Ambulatory Visit: Payer: Self-pay | Admitting: Physician Assistant

## 2014-08-04 DIAGNOSIS — N201 Calculus of ureter: Secondary | ICD-10-CM

## 2014-08-09 ENCOUNTER — Ambulatory Visit (INDEPENDENT_AMBULATORY_CARE_PROVIDER_SITE_OTHER): Payer: BC Managed Care – PPO

## 2014-08-09 DIAGNOSIS — N201 Calculus of ureter: Secondary | ICD-10-CM

## 2014-08-09 DIAGNOSIS — Z01818 Encounter for other preprocedural examination: Secondary | ICD-10-CM

## 2014-08-09 DIAGNOSIS — Z87442 Personal history of urinary calculi: Secondary | ICD-10-CM

## 2014-08-22 ENCOUNTER — Other Ambulatory Visit: Payer: Self-pay | Admitting: *Deleted

## 2014-08-22 MED ORDER — TOPIRAMATE 25 MG PO TABS: ORAL_TABLET | ORAL | Status: DC

## 2014-08-22 NOTE — Telephone Encounter (Signed)
Pt reports that she did see Dr. Roni Bread (urology) and he told her that the Topamax is causing the kidney stones. She stated that she will need to be weaned off of this medication and will need a refill in the interim. Please advise.Cassandra Ford [place]

## 2014-08-25 ENCOUNTER — Other Ambulatory Visit: Payer: Self-pay | Admitting: Family Medicine

## 2014-09-06 ENCOUNTER — Other Ambulatory Visit: Payer: Self-pay | Admitting: Family Medicine

## 2014-09-14 ENCOUNTER — Other Ambulatory Visit: Payer: Self-pay | Admitting: Family Medicine

## 2014-09-19 ENCOUNTER — Other Ambulatory Visit: Payer: Self-pay | Admitting: Family Medicine

## 2014-10-18 ENCOUNTER — Other Ambulatory Visit: Payer: Self-pay | Admitting: Family Medicine

## 2014-11-23 ENCOUNTER — Other Ambulatory Visit: Payer: Self-pay | Admitting: Family Medicine

## 2014-12-21 ENCOUNTER — Other Ambulatory Visit: Payer: Self-pay | Admitting: Family Medicine

## 2014-12-26 ENCOUNTER — Telehealth: Payer: Self-pay | Admitting: *Deleted

## 2014-12-26 MED ORDER — CYCLOBENZAPRINE HCL 5 MG PO TABS: ORAL_TABLET | ORAL | Status: DC

## 2014-12-26 MED ORDER — ALPRAZOLAM 1 MG PO TABS: ORAL_TABLET | ORAL | Status: DC

## 2014-12-26 NOTE — Telephone Encounter (Signed)
Pt called asking for refill of meds. She told me that she is scheduled for surg on 5/16. And at this time it is hard for her to come in to see Dr. Madilyn Fireman  I informed her that she will need a f/u appt because we cannot continue to prescribe these meds without her following up. Pt voiced understanding and agreed.Cassandra Ford'

## 2015-01-24 ENCOUNTER — Other Ambulatory Visit: Payer: Self-pay | Admitting: Family Medicine

## 2015-02-02 ENCOUNTER — Encounter: Payer: Self-pay | Admitting: Family Medicine

## 2015-02-02 ENCOUNTER — Ambulatory Visit (INDEPENDENT_AMBULATORY_CARE_PROVIDER_SITE_OTHER): Payer: BC Managed Care – PPO | Admitting: Family Medicine

## 2015-02-02 VITALS — BP 120/82 | HR 92

## 2015-02-02 DIAGNOSIS — G43009 Migraine without aura, not intractable, without status migrainosus: Secondary | ICD-10-CM | POA: Diagnosis not present

## 2015-02-02 DIAGNOSIS — M609 Myositis, unspecified: Secondary | ICD-10-CM

## 2015-02-02 DIAGNOSIS — F411 Generalized anxiety disorder: Secondary | ICD-10-CM

## 2015-02-02 DIAGNOSIS — IMO0001 Reserved for inherently not codable concepts without codable children: Secondary | ICD-10-CM

## 2015-02-02 DIAGNOSIS — M546 Pain in thoracic spine: Secondary | ICD-10-CM

## 2015-02-02 DIAGNOSIS — M549 Dorsalgia, unspecified: Secondary | ICD-10-CM

## 2015-02-02 DIAGNOSIS — M791 Myalgia: Secondary | ICD-10-CM

## 2015-02-02 MED ORDER — RIZATRIPTAN BENZOATE 10 MG PO TABS: 10.0000 mg | ORAL_TABLET | ORAL | Status: AC | PRN

## 2015-02-02 MED ORDER — ALPRAZOLAM 1 MG PO TABS: ORAL_TABLET | ORAL | Status: DC

## 2015-02-02 MED ORDER — PROPRANOLOL HCL 40 MG PO TABS: 40.0000 mg | ORAL_TABLET | Freq: Two times a day (BID) | ORAL | Status: AC

## 2015-02-02 NOTE — Progress Notes (Signed)
   Subjective:    Patient ID: Cassandra Ford, female    DOB: 25-Jan-1961, 54 y.o.   MRN: [id number]  HPI She is on schedule for a complete hysterectomy on Monday for fibroids. She wanted to come in today just to make sure that she had refills on all of her medications before she goes out for 6 weeks.  Migraine - she is off topamax bc they thought was causing kidney stones.  She has been post menopausal.  A lot of sinus pressure. Pain is almost daily headache and pressure especially over her forehead..  No fevers chills or sweats.   Upper back pain and shoulder pain. She decided not to have the surgery through workers comp. But would like a refill on the muscle relaxer since it is helpful. She uses it as needed.    Anxiety-due for refill on her Xanax. She says she tries to use it sparingly. She has been a little bit more anxious this last week with her upcoming surgery but otherwise feels like she's doing well.  Review of Systems     Objective:   Physical Exam  Constitutional: She is oriented to person, place, and time. She appears well-developed and well-nourished.  HENT:  Head: Normocephalic and atraumatic.  Cardiovascular: Normal rate, regular rhythm and normal heart sounds.   Pulmonary/Chest: Effort normal and breath sounds normal.  Neurological: She is alert and oriented to person, place, and time.  Skin: Skin is warm and dry.  Psychiatric: She has a normal mood and affect. Her behavior is normal.          Assessment & Plan:  Migraine HA- will start betablocker since the neurologist felt like the Topamax could have triggered her urinary stones. We'll see her back in 6-8 weeks to make sure that she's doing well medication and to adjust the dose if needed. If she feels like her sinus pressure and pain is getting worse then come back and we can ice check her for sinus infection. We've refilled her Maxalt as well.  Upper back and shoulder pain-will treat with muscle relaxer. Refill  provided today.  Fibroids-scheduled for complete history to me on Monday.  Anxiety-will refill Xanax today.F/U in 6 months.

## 2015-02-20 ENCOUNTER — Telehealth: Payer: Self-pay | Admitting: *Deleted

## 2015-02-20 MED ORDER — CYCLOBENZAPRINE HCL 5 MG PO TABS: ORAL_TABLET | ORAL | Status: DC

## 2015-02-20 NOTE — Telephone Encounter (Signed)
Refill sent.Marland KitchenMarland KitchenAudelia Hives [place]

## 2015-02-23 ENCOUNTER — Encounter: Payer: Self-pay | Admitting: Family Medicine

## 2015-03-17 ENCOUNTER — Other Ambulatory Visit: Payer: Self-pay | Admitting: Family Medicine

## 2015-04-11 ENCOUNTER — Other Ambulatory Visit: Payer: Self-pay | Admitting: Family Medicine

## 2015-05-13 ENCOUNTER — Other Ambulatory Visit: Payer: Self-pay | Admitting: Family Medicine

## 2015-06-18 ENCOUNTER — Other Ambulatory Visit: Payer: Self-pay | Admitting: Family Medicine

## 2015-06-18 MED ORDER — CYCLOBENZAPRINE HCL 5 MG PO TABS: 5.0000 mg | ORAL_TABLET | Freq: Every evening | ORAL | Status: DC | PRN

## 2015-06-27 ENCOUNTER — Ambulatory Visit (INDEPENDENT_AMBULATORY_CARE_PROVIDER_SITE_OTHER): Payer: BC Managed Care – PPO | Admitting: Family Medicine

## 2015-06-27 ENCOUNTER — Encounter: Payer: Self-pay | Admitting: Family Medicine

## 2015-06-27 ENCOUNTER — Other Ambulatory Visit: Payer: Self-pay | Admitting: Family Medicine

## 2015-06-27 VITALS — BP 123/67 | HR 80 | Temp 98.4°F | Ht 65.0 in | Wt 203.0 lb

## 2015-06-27 DIAGNOSIS — E669 Obesity, unspecified: Secondary | ICD-10-CM

## 2015-06-27 DIAGNOSIS — R5383 Other fatigue: Secondary | ICD-10-CM

## 2015-06-27 DIAGNOSIS — R0902 Hypoxemia: Secondary | ICD-10-CM | POA: Diagnosis not present

## 2015-06-27 DIAGNOSIS — Z13220 Encounter for screening for lipoid disorders: Secondary | ICD-10-CM

## 2015-06-27 DIAGNOSIS — Z6833 Body mass index (BMI) 33.0-33.9, adult: Secondary | ICD-10-CM

## 2015-06-27 LAB — CBC WITH DIFFERENTIAL/PLATELET
BASOS PCT: 0 % (ref 0–1)
Basophils Absolute: 0 10*3/uL (ref 0.0–0.1)
EOS ABS: 0.1 10*3/uL (ref 0.0–0.7)
EOS PCT: 2 % (ref 0–5)
HCT: 45 % (ref 36.0–46.0)
Hemoglobin: 15 g/dL (ref 12.0–15.0)
Lymphocytes Relative: 28 % (ref 12–46)
Lymphs Abs: 1.9 10*3/uL (ref 0.7–4.0)
MCH: 28.8 pg (ref 26.0–34.0)
MCHC: 33.3 g/dL (ref 30.0–36.0)
MCV: 86.5 fL (ref 78.0–100.0)
MONOS PCT: 7 % (ref 3–12)
MPV: 9.8 fL (ref 8.6–12.4)
Monocytes Absolute: 0.5 10*3/uL (ref 0.1–1.0)
NEUTROS PCT: 63 % (ref 43–77)
Neutro Abs: 4.3 10*3/uL (ref 1.7–7.7)
PLATELETS: 349 10*3/uL (ref 150–400)
RBC: 5.2 MIL/uL — ABNORMAL HIGH (ref 3.87–5.11)
RDW: 14.3 % (ref 11.5–15.5)
WBC: 6.8 10*3/uL (ref 4.0–10.5)

## 2015-06-27 LAB — COMPLETE METABOLIC PANEL WITH GFR
ALBUMIN: 4.3 g/dL (ref 3.6–5.1)
ALK PHOS: 92 U/L (ref 33–130)
ALT: 21 U/L (ref 6–29)
AST: 22 U/L (ref 10–35)
BILIRUBIN TOTAL: 0.5 mg/dL (ref 0.2–1.2)
BUN: 8 mg/dL (ref 7–25)
CALCIUM: 9.4 mg/dL (ref 8.6–10.4)
CO2: 28 mmol/L (ref 20–31)
Chloride: 102 mmol/L (ref 98–110)
Creat: 0.74 mg/dL (ref 0.50–1.05)
Glucose, Bld: 81 mg/dL (ref 65–99)
Potassium: 4.5 mmol/L (ref 3.5–5.3)
Sodium: 139 mmol/L (ref 135–146)
TOTAL PROTEIN: 7.1 g/dL (ref 6.1–8.1)

## 2015-06-27 LAB — LIPID PANEL
CHOLESTEROL: 217 mg/dL — AB (ref 125–200)
HDL: 63 mg/dL (ref >=46)
LDL Cholesterol: 126 mg/dL (ref <130)
TRIGLYCERIDES: 141 mg/dL (ref <150)
Total CHOL/HDL Ratio: 3.4 Ratio (ref <=5.0)
VLDL: 28 mg/dL (ref <30)

## 2015-06-27 NOTE — Progress Notes (Signed)
   Subjective:    Patient ID: Cassandra Ford, female    DOB: 01-14-1961, 54 y.o.   MRN: [id number]  HPI She has been more tired since her D&C in May. Tries to eat oatmeal but can't seem to keep it down. She wants to lose weight but has not energy to work out. Feels drained by the end of the day.  Has had some sinus issues and has been taking sudafed.  She has been taking a menopausal OTC supplement. Says she sweats easily and gets SOB with activity. Says this is new.   More frequent + HA.    She had major issues post op. She says her oxygen dropped to 70% and was in the ICU . She did recover but still doesn't feel well. She feels more SOB.   They have strongly recommended that she get evaluated for sleep apnea. The she denies any snoring and has never had any witnessed apneic events.  Says she doesn't feel depressed.    Review of Systems     Objective:   Physical Exam  Constitutional: She is oriented to person, place, and time. She appears well-developed and well-nourished.  HENT:  Head: Normocephalic and atraumatic.  Cardiovascular: Normal rate, regular rhythm and normal heart sounds.   Pulmonary/Chest: Effort normal and breath sounds normal.  Abdominal: Soft. Bowel sounds are normal. She exhibits no distension and no mass. There is no tenderness. There is no rebound and no guarding.  Neurological: She is alert and oriented to person, place, and time.  Skin: Skin is warm and dry.  Psychiatric: She has a normal mood and affect. Her behavior is normal.          Assessment & Plan:  Fatigue - could be her betablocker.  Will stop the propranolol for 6 weeks.  May need to replace it with something else.      nocturnal hypoxemia- witness post surgery. Her stop BANG questionnaire score was only positive for 2 questions which is technically a negative screen but they did recommend that she get evaluated further. We could try to get her set a schedule for a home sleep study versus a home  overnight pulse oximetry.   abnormal weight gain/BMI 33-we discussed options such as phentermine which a stimulant was could help with weight loss and help with a little bit of energy issue.

## 2015-06-28 LAB — VITAMIN B12: VITAMIN B 12: 1091 pg/mL — AB (ref 211–911)

## 2015-06-28 LAB — TSH: TSH: 2.028 u[IU]/mL (ref 0.350–4.500)

## 2015-06-28 LAB — FERRITIN: Ferritin: 86 ng/mL (ref 10–291)

## 2015-07-11 ENCOUNTER — Other Ambulatory Visit: Payer: Self-pay | Admitting: Family Medicine

## 2015-07-23 ENCOUNTER — Encounter: Payer: Self-pay | Admitting: Family Medicine

## 2015-07-23 ENCOUNTER — Ambulatory Visit (INDEPENDENT_AMBULATORY_CARE_PROVIDER_SITE_OTHER): Payer: BC Managed Care – PPO | Admitting: Family Medicine

## 2015-07-23 VITALS — BP 123/71 | HR 91 | Wt 202.0 lb

## 2015-07-23 DIAGNOSIS — R5383 Other fatigue: Secondary | ICD-10-CM | POA: Diagnosis not present

## 2015-07-23 DIAGNOSIS — J019 Acute sinusitis, unspecified: Secondary | ICD-10-CM

## 2015-07-23 MED ORDER — AMOXICILLIN-POT CLAVULANATE 875-125 MG PO TABS: 1.0000 | ORAL_TABLET | Freq: Two times a day (BID) | ORAL | Status: DC

## 2015-07-23 NOTE — Progress Notes (Signed)
   Subjective:    Patient ID: Cassandra Ford, female    DOB: Jun 07, 1961, 54 y.o.   MRN: [id number]  HPI F/U FAtigue - says she did stop the propranolol she has felt better. Maybe 10% better but also experienced the death of a friend recently. In fact that friend was taking propranolol when he died and that part of the reason she was nervous about the medication. We did blood work at her last office visit that well.  Complain of upper respiratory symptoms for a week. Missed work today.  She has ben really congested in her sinsuses and mild ST.  No fever, chills or sweats.  Some dry cough. Lots of facial pressure and pain as well as headache.  Review of Systems     Objective:   Physical Exam  Constitutional: She is oriented to person, place, and time. She appears well-developed and well-nourished.  HENT:  Head: Normocephalic and atraumatic.  Right Ear: External ear normal.  Left Ear: External ear normal.  Nose: Nose normal.  Mouth/Throat: Oropharynx is clear and moist.  TMs and canals are clear.   Eyes: Conjunctivae and EOM are normal. Pupils are equal, round, and reactive to light.  Neck: Neck supple. No thyromegaly present.  Cardiovascular: Normal rate, regular rhythm and normal heart sounds.   Pulmonary/Chest: Effort normal and breath sounds normal. She has no wheezes.  Lymphadenopathy:    She has no cervical adenopathy.  Neurological: She is alert and oriented to person, place, and time.  Skin: Skin is warm and dry.  Psychiatric: She has a normal mood and affect.          Assessment & Plan:  Fatigue - somewhat improved over the propranolol. For now we will just hold the medication and try to get her sinus infection improved and see how she does. If she feels like she still getting some frequent headaches off of the medication and will need to find something in its place.  Acute sinusitis - will tx with augmentin.  Call if not better in one week.

## 2015-07-30 ENCOUNTER — Other Ambulatory Visit: Payer: Self-pay | Admitting: Family Medicine

## 2015-08-01 ENCOUNTER — Other Ambulatory Visit: Payer: Self-pay | Admitting: Family Medicine

## 2015-08-02 ENCOUNTER — Other Ambulatory Visit: Payer: Self-pay | Admitting: *Deleted

## 2015-08-02 MED ORDER — ALPRAZOLAM 1 MG PO TABS: 1.0000 mg | ORAL_TABLET | Freq: Two times a day (BID) | ORAL | Status: DC | PRN

## 2015-08-03 ENCOUNTER — Other Ambulatory Visit: Payer: Self-pay | Admitting: Family Medicine

## 2015-08-18 ENCOUNTER — Other Ambulatory Visit: Payer: Self-pay | Admitting: Family Medicine

## 2015-09-05 ENCOUNTER — Telehealth: Payer: Self-pay | Admitting: Family Medicine

## 2015-09-05 NOTE — Telephone Encounter (Signed)
Pt is wanting to know if you have figured out a medication she can take for her migraines because she is suffering some days. Thanks

## 2015-09-07 ENCOUNTER — Other Ambulatory Visit: Payer: Self-pay | Admitting: Family Medicine

## 2015-09-07 MED ORDER — AMITRIPTYLINE HCL 25 MG PO TABS: 25.0000 mg | ORAL_TABLET | Freq: Every day | ORAL | Status: AC

## 2015-09-07 NOTE — Telephone Encounter (Signed)
Let's try amitriptyline. Start with one tab at bedtime for about 2 weeks and then if she feels she needs to go up to 2 tabs okay to do so. Prescription sent to pharmacy. If that's not effective and we can consider switching to venlafaxine.

## 2015-09-10 NOTE — Telephone Encounter (Signed)
Left message advising of recommendations.  

## 2015-09-11 ENCOUNTER — Other Ambulatory Visit: Payer: Self-pay | Admitting: Family Medicine

## 2015-09-11 ENCOUNTER — Telehealth: Payer: Self-pay | Admitting: Family Medicine

## 2015-09-11 NOTE — Telephone Encounter (Signed)
Please call pt: she is due for a mammogram. We are happy to place order for her to go downstairs if she would like.

## 2015-09-11 NOTE — Telephone Encounter (Signed)
Patient advised.

## 2015-09-12 NOTE — Telephone Encounter (Signed)
Left detailed message on patient vm with recommendations as noted below. Advised patient to call the office if she wanted to do the mammogram. Oneta Rack

## 2015-09-18 IMAGING — CR DG ABDOMEN 1V
1 series · 1 of 1 positions shown · non-contrast
Comparison: None.

CLINICAL DATA: History of kidney stones.  Preop for surgery.

EXAM:
ABDOMEN - 1 VIEW

[view not recorded]
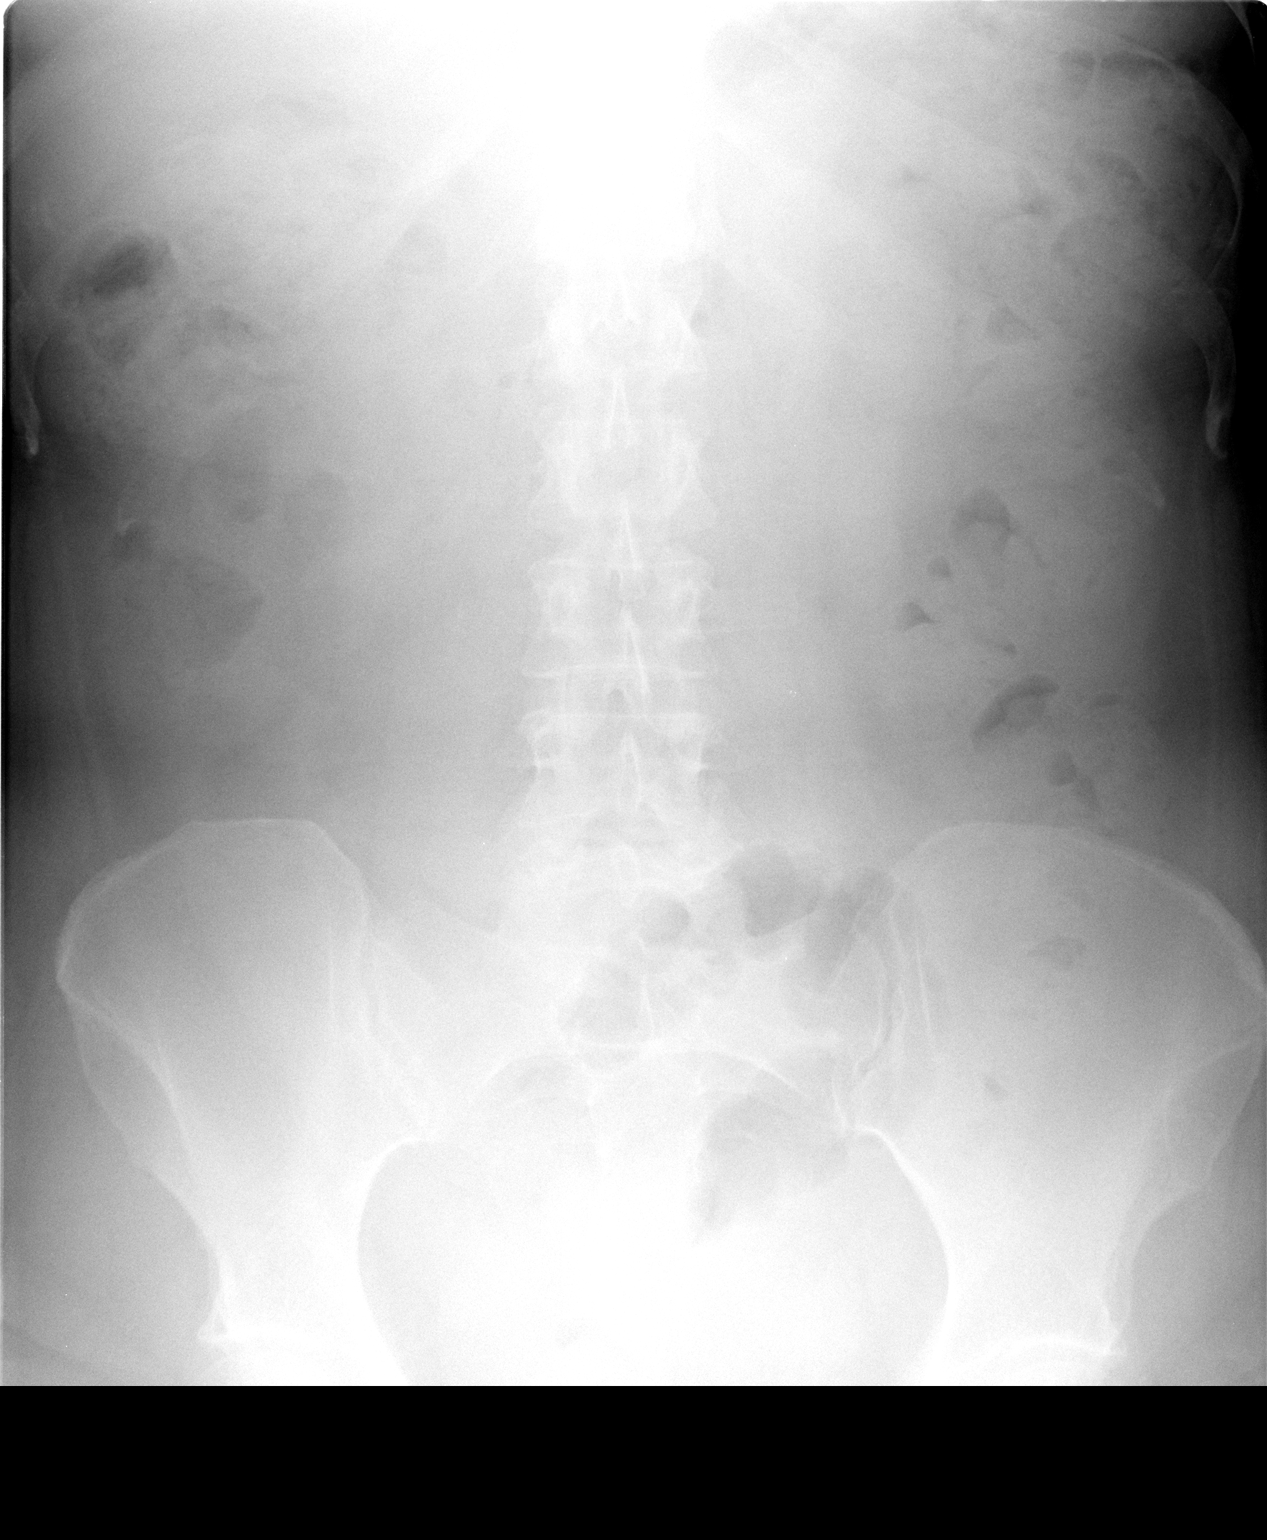

[1 of 1 positions shown; findings below may reference images not displayed]

FINDINGS: Moderate stool throughout the colon may suggest constipation. No
distended small bowel loops. No obvious renal or ureteral calculi.
The soft tissue shadows are not well identified. Could not exclude
ascites or fluid-filled small bowel loops.
IMPRESSION: Moderate stool throughout the colon may suggest constipation.

No definite renal or ureteral calculi.

## 2015-10-04 ENCOUNTER — Other Ambulatory Visit: Payer: Self-pay | Admitting: Family Medicine

## 2015-10-09 ENCOUNTER — Other Ambulatory Visit: Payer: Self-pay | Admitting: Family Medicine

## 2015-10-12 ENCOUNTER — Encounter: Payer: Self-pay | Admitting: Family Medicine

## 2015-10-12 ENCOUNTER — Ambulatory Visit (INDEPENDENT_AMBULATORY_CARE_PROVIDER_SITE_OTHER): Payer: BC Managed Care – PPO | Admitting: Family Medicine

## 2015-10-12 VITALS — BP 124/75 | HR 102 | Temp 97.6°F | Wt 210.0 lb

## 2015-10-12 DIAGNOSIS — J069 Acute upper respiratory infection, unspecified: Secondary | ICD-10-CM

## 2015-10-12 NOTE — Progress Notes (Signed)
   Subjective:    Patient ID: Cassandra Ford, female    DOB: July 25, 1961, 55 y.o.   MRN: [id number]  HPI 2 days of cough and congestion, fever, bodyaches. Fever to 102.  She took 6 Tylenol yesterday.  Joints are achey.  Mild ST as well.  Has been peeing a little more than usualyy.    Review of Systems     Objective:   Physical Exam  Constitutional: She is oriented to person, place, and time. She appears well-developed and well-nourished.  HENT:  Head: Normocephalic and atraumatic.  Right Ear: External ear normal.  Left Ear: External ear normal.  Nose: Nose normal.  Mouth/Throat: Oropharynx is clear and moist.  TMs and canals are clear.   Eyes: Conjunctivae and EOM are normal. Pupils are equal, round, and reactive to light.  Neck: Neck supple. No thyromegaly present.  Cardiovascular: Normal rate, regular rhythm and normal heart sounds.   Pulmonary/Chest: Effort normal and breath sounds normal. She has no wheezes.  Lymphadenopathy:    She has no cervical adenopathy.  Neurological: She is alert and oriented to person, place, and time.  Skin: Skin is warm and dry.  Psychiatric: She has a normal mood and affect.          Assessment & Plan:  URI - likely viral. Call if not better in one week.  Call if fever persists.  Work note given.  Neg flu test.

## 2015-10-12 NOTE — Patient Instructions (Signed)
Upper Respiratory Infection, Adult Most upper respiratory infections (URIs) are a viral infection of the air passages leading to the lungs. A URI affects the nose, throat, and upper air passages. The most common type of URI is nasopharyngitis and is typically referred to as "the common cold." URIs run their course and usually go away on their own. Most of the time, a URI does not require medical attention, but sometimes a bacterial infection in the upper airways can follow a viral infection. This is called a secondary infection. Sinus and middle ear infections are common types of secondary upper respiratory infections. Bacterial pneumonia can also complicate a URI. A URI can worsen asthma and chronic obstructive pulmonary disease (COPD). Sometimes, these complications can require emergency medical care and may be life threatening.  CAUSES Almost all URIs are caused by viruses. A virus is a type of germ and can spread from one person to another.  RISKS FACTORS You may be at risk for a URI if:   You smoke.   You have chronic heart or lung disease.  You have a weakened defense (immune) system.   You are very young or very old.   You have nasal allergies or asthma.  You work in crowded or poorly ventilated areas.  You work in health care facilities or schools. SIGNS AND SYMPTOMS  Symptoms typically develop 2-3 days after you come in contact with a cold virus. Most viral URIs last 7-10 days. However, viral URIs from the influenza virus (flu virus) can last 14-18 days and are typically more severe. Symptoms may include:   Runny or stuffy (congested) nose.   Sneezing.   Cough.   Sore throat.   Headache.   Fatigue.   Fever.   Loss of appetite.   Pain in your forehead, behind your eyes, and over your cheekbones (sinus pain).  Muscle aches.  DIAGNOSIS  Your health care provider may diagnose a URI by:  Physical exam.  Tests to check that your symptoms are not due to  another condition such as:  Strep throat.  Sinusitis.  Pneumonia.  Asthma. TREATMENT  A URI goes away on its own with time. It cannot be cured with medicines, but medicines may be prescribed or recommended to relieve symptoms. Medicines may help:  Reduce your fever.  Reduce your cough.  Relieve nasal congestion. HOME CARE INSTRUCTIONS   Take medicines only as directed by your health care provider.   Gargle warm saltwater or take cough drops to comfort your throat as directed by your health care provider.  Use a warm mist humidifier or inhale steam from a shower to increase air moisture. This may make it easier to breathe.  Drink enough fluid to keep your urine clear or pale yellow.   Eat soups and other clear broths and maintain good nutrition.   Rest as needed.   Return to work when your temperature has returned to normal or as your health care provider advises. You may need to stay home longer to avoid infecting others. You can also use a face mask and careful hand washing to prevent spread of the virus.  Increase the usage of your inhaler if you have asthma.   Do not use any tobacco products, including cigarettes, chewing tobacco, or electronic cigarettes. If you need help quitting, ask your health care provider. PREVENTION  The best way to protect yourself from getting a cold is to practice good hygiene.   Avoid oral or hand contact with people with cold   symptoms.   Wash your hands often if contact occurs.  There is no clear evidence that vitamin C, vitamin E, echinacea, or exercise reduces the chance of developing a cold. However, it is always recommended to get plenty of rest, exercise, and practice good nutrition.  SEEK MEDICAL CARE IF:   You are getting worse rather than better.   Your symptoms are not controlled by medicine.   You have chills.  You have worsening shortness of breath.  You have brown or red mucus.  You have yellow or brown nasal  discharge.  You have pain in your face, especially when you bend forward.  You have a fever.  You have swollen neck glands.  You have pain while swallowing.  You have white areas in the back of your throat. SEEK IMMEDIATE MEDICAL CARE IF:   You have severe or persistent:  Headache.  Ear pain.  Sinus pain.  Chest pain.  You have chronic lung disease and any of the following:  Wheezing.  Prolonged cough.  Coughing up blood.  A change in your usual mucus.  You have a stiff neck.  You have changes in your:  Vision.  Hearing.  Thinking.  Mood. MAKE SURE YOU:   Understand these instructions.  Will watch your condition.  Will get help right away if you are not doing well or get worse.   This information is not intended to replace advice given to you by your health care provider. Make sure you discuss any questions you have with your health care provider.   Document Released: 03/18/2001 Document Revised: 02/06/2015 Document Reviewed: 12/28/2013 [employer] Interactive Patient Education 2016 [employer]  

## 2015-11-07 DEATH — deceased
